# Patient Record
Sex: Female | Born: 1950 | Smoking: Former smoker
Health system: Southern US, Community
[De-identification: ages and names within clinical notes are randomized; demographics above are authoritative.]

## PROBLEM LIST (undated history)

## (undated) DIAGNOSIS — J189 Pneumonia, unspecified organism: Secondary | ICD-10-CM

---
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
## 1988-07-16 HISTORY — PX: SPINE SURGERY: SHX786

## 1999-07-21 ENCOUNTER — Encounter: Admission: RE | Admit: 1999-07-21 | Discharge: 1999-10-19 | Payer: Self-pay | Admitting: Neurosurgery

## 2013-06-05 ENCOUNTER — Other Ambulatory Visit: Payer: Self-pay | Admitting: Chiropractic Medicine

## 2013-06-08 ENCOUNTER — Other Ambulatory Visit: Payer: Self-pay | Admitting: Chiropractic Medicine

## 2013-06-08 DIAGNOSIS — Z780 Asymptomatic menopausal state: Secondary | ICD-10-CM

## 2013-07-15 ENCOUNTER — Ambulatory Visit
Admission: RE | Admit: 2013-07-15 | Discharge: 2013-07-15 | Disposition: A | Discharge status: Home / Self Care | Payer: No Typology Code available for payment source | Source: Ambulatory Visit | Attending: Chiropractic Medicine | Admitting: Chiropractic Medicine

## 2013-07-15 DIAGNOSIS — Z780 Asymptomatic menopausal state: Secondary | ICD-10-CM

## 2014-03-19 ENCOUNTER — Encounter (HOSPITAL_COMMUNITY): Payer: Self-pay

## 2014-03-19 ENCOUNTER — Emergency Department (HOSPITAL_COMMUNITY): Payer: Medicare Other

## 2014-03-19 ENCOUNTER — Emergency Department (HOSPITAL_COMMUNITY)
Admission: EM | Admit: 2014-03-19 | Discharge: 2014-03-19 | Disposition: A | Discharge status: Home / Self Care | Payer: Medicare Other | Attending: Emergency Medicine | Admitting: Emergency Medicine

## 2014-03-19 DIAGNOSIS — R55 Syncope and collapse: Secondary | ICD-10-CM | POA: Diagnosis not present

## 2014-03-19 DIAGNOSIS — R32 Unspecified urinary incontinence: Secondary | ICD-10-CM | POA: Insufficient documentation

## 2014-03-19 DIAGNOSIS — R404 Transient alteration of awareness: Secondary | ICD-10-CM | POA: Diagnosis present

## 2014-03-19 LAB — URINALYSIS, ROUTINE W REFLEX MICROSCOPIC
Bilirubin Urine: NEGATIVE
GLUCOSE, UA: NEGATIVE mg/dL
Ketones, ur: NEGATIVE mg/dL
Nitrite: NEGATIVE
PH: 5.5 (ref 5.0–8.0)
PROTEIN: NEGATIVE mg/dL
Specific Gravity, Urine: 1.015 (ref 1.005–1.030)
Urobilinogen, UA: 0.2 mg/dL (ref 0.0–1.0)

## 2014-03-19 LAB — BASIC METABOLIC PANEL
Anion gap: 11 (ref 5–15)
BUN: 13 mg/dL (ref 6–23)
CHLORIDE: 93 meq/L — AB (ref 96–112)
CO2: 28 mEq/L (ref 19–32)
Calcium: 9.1 mg/dL (ref 8.4–10.5)
Creatinine, Ser: 0.71 mg/dL (ref 0.50–1.10)
GFR, EST NON AFRICAN AMERICAN: 89 mL/min — AB (ref >90)
Glucose, Bld: 112 mg/dL — ABNORMAL HIGH (ref 70–99)
Potassium: 4.2 mEq/L (ref 3.7–5.3)
Sodium: 132 mEq/L — ABNORMAL LOW (ref 137–147)

## 2014-03-19 LAB — CBC WITH DIFFERENTIAL/PLATELET
BASOS PCT: 0 % (ref 0–1)
Basophils Absolute: 0 10*3/uL (ref 0.0–0.1)
EOS ABS: 0.1 10*3/uL (ref 0.0–0.7)
Eosinophils Relative: 1 % (ref 0–5)
HEMATOCRIT: 41.2 % (ref 36.0–46.0)
HEMOGLOBIN: 14.3 g/dL (ref 12.0–15.0)
Lymphocytes Relative: 13 % (ref 12–46)
Lymphs Abs: 1.5 10*3/uL (ref 0.7–4.0)
MCH: 32.4 pg (ref 26.0–34.0)
MCHC: 34.7 g/dL (ref 30.0–36.0)
MCV: 93.4 fL (ref 78.0–100.0)
MONOS PCT: 7 % (ref 3–12)
Monocytes Absolute: 0.9 10*3/uL (ref 0.1–1.0)
Neutro Abs: 9.5 10*3/uL — ABNORMAL HIGH (ref 1.7–7.7)
Neutrophils Relative %: 79 % — ABNORMAL HIGH (ref 43–77)
Platelets: 301 10*3/uL (ref 150–400)
RBC: 4.41 MIL/uL (ref 3.87–5.11)
RDW: 13.8 % (ref 11.5–15.5)
WBC: 12 10*3/uL — ABNORMAL HIGH (ref 4.0–10.5)

## 2014-03-19 LAB — URINE MICROSCOPIC-ADD ON

## 2014-03-19 LAB — MAGNESIUM: MAGNESIUM: 2.1 mg/dL (ref 1.5–2.5)

## 2014-03-19 LAB — LACTIC ACID, PLASMA: Lactic Acid, Venous: 1.3 mmol/L (ref 0.5–2.2)

## 2014-03-19 MED ORDER — SODIUM CHLORIDE 0.9 % IV BOLUS (SEPSIS)
1000.0000 mL | Freq: Once | INTRAVENOUS | Status: AC
  Administered 2014-03-19: 1000 mL via INTRAVENOUS

## 2014-03-19 NOTE — ED Notes (Signed)
Possible seizure like activity, per husband.

## 2014-03-19 NOTE — Discharge Instructions (Signed)
Syncope Traci Ballard, you were seen today after passing out.  We did not find a reason for this.  You have been stable in the emergency department after monitoring.  Follow up with your regular doctor within 3 days for continued care.  If your symptoms return or worsen, come back to the ED for repeat evaluation. Thank you. Syncope means a person passes out (faints). The person usually wakes up in less than 5 minutes. It is important to seek medical care for syncope. HOME CARE  Have someone stay with you until you feel normal.  Do not drive, use machines, or play sports until your doctor says it is okay.  Keep all doctor visits as told.  Lie down when you feel like you might pass out. Take deep breaths. Wait until you feel normal before standing up.  Drink enough fluids to keep your pee (urine) clear or pale yellow.  If you take blood pressure or heart medicine, get up slowly. Take several minutes to sit and then stand. GET HELP RIGHT AWAY IF:   You have a severe headache.  You have pain in the chest, belly (abdomen), or back.  You are bleeding from the mouth or butt (rectum).  You have black or tarry poop (stool).  You have an irregular or very fast heartbeat.  You have pain with breathing.  You keep passing out, or you have shaking (seizures) when you pass out.  You pass out when sitting or lying down.  You feel confused.  You have trouble walking.  You have severe weakness.  You have vision problems. If you fainted, call your local emergency services (911 in [place] Do not drive yourself to the hospital. MAKE SURE YOU:   Understand these instructions.  Will watch your condition.  Will get help right away if you are not doing well or get worse. Document Released: 12/19/2007 Document Revised: 01/01/2012 Document Reviewed: 08/31/2011 [facility] Patient Information 2015 [place], [place]. This information is not intended to replace advice given to you by your health care  provider. Make sure you discuss any questions you have with your health care provider.

## 2014-03-19 NOTE — ED Provider Notes (Signed)
CSN: [id number]     Arrival date & time 03/19/14  0210 History   First MD Initiated Contact with Patient 03/19/14 0214     Chief Complaint  Patient presents with  . Loss of Consciousness     (Consider location/radiation/quality/duration/timing/severity/associated sxs/prior Treatment) HPI  Traci Ballard is a 63 y.o. female with no significant past medical history presenting today after syncopal episode. Patient only remembers feeling is oriented then waking up to her husband on the ground. He felt as if she has to stand up prior to the incident. She is unable to give further details of what this means. Per her husband she was sitting playing cards at the computer. She then all of a sudden became unresponsive. He later on the ground and could not wake her up. He states he continued to breathe but it was labored. He denies any color change. She then had an episode of urinary incontinence, and when she finally woke up husband describes a possible post ictal state for 1 minute. Patient denies this ever happening in the past. She denies any prodromal headache chest pain abdominal pain or shortness of breath. She now feels back to her normal state.  10 Systems reviewed and are negative for acute change except as noted in the HPI.    No past medical history on file. No past surgical history on file. No family history on file. History  Substance Use Topics  . Smoking status: Not on file  . Smokeless tobacco: Not on file  . Alcohol Use: Not on file   OB History   No data available     Review of Systems    Allergies  Review of patient's allergies indicates not on file.  Home Medications   Prior to Admission medications   Not on File   BP 118/56  Pulse 72  Resp 12  SpO2 98% Physical Exam  Nursing note and vitals reviewed. Constitutional: She is oriented to person, place, and time. She appears well-developed and well-nourished. No distress.  HENT:  Head: Normocephalic and  atraumatic.  Eyes: Conjunctivae and EOM are normal. Pupils are equal, round, and reactive to light. No scleral icterus.  Neck: Normal range of motion. Neck supple. No JVD present. No tracheal deviation present. No thyromegaly present.  Cardiovascular: Normal rate, regular rhythm and normal heart sounds.  Exam reveals no gallop and no friction rub.   No murmur heard. Pulmonary/Chest: Effort normal and breath sounds normal. No respiratory distress. She has no wheezes. She exhibits no tenderness.  Abdominal: Soft. Bowel sounds are normal. She exhibits no distension and no mass. There is no tenderness. There is no rebound and no guarding.  Musculoskeletal: Normal range of motion. She exhibits no edema and no tenderness.  Lymphadenopathy:    She has no cervical adenopathy.  Neurological: She is alert and oriented to person, place, and time. No cranial nerve deficit. She exhibits normal muscle tone. Coordination normal.  Skin: Skin is warm and dry. No rash noted. She is not diaphoretic. No erythema. No pallor.    ED Course  Procedures (including critical care time) Labs Review Labs Reviewed  CBC WITH DIFFERENTIAL - Abnormal; Notable for the following:    WBC 12.0 (*)    Neutrophils Relative % 79 (*)    Neutro Abs 9.5 (*)    All other components within normal limits  BASIC METABOLIC PANEL - Abnormal; Notable for the following:    Sodium 132 (*)    Chloride 93 (*)  Glucose, Bld 112 (*)    GFR calc non Af Amer 89 (*)    All other components within normal limits  URINALYSIS, ROUTINE W REFLEX MICROSCOPIC - Abnormal; Notable for the following:    Hgb urine dipstick TRACE (*)    Leukocytes, UA SMALL (*)    All other components within normal limits  URINE MICROSCOPIC-ADD ON - Abnormal; Notable for the following:    Squamous Epithelial / LPF FEW (*)    All other components within normal limits  LACTIC ACID, PLASMA  MAGNESIUM    Imaging Review No results found.   EKG  Interpretation None      MDM   Final diagnoses:  None    Patient presents today out of concern for syncope versus seizure. It is difficult to ascertain which it was from the history. We'll do a broad-spectrum workup including CT of the head, laboratory studies, an infectious workup.   Imaging studies are normal. Laboratory studies reveal white count of 12, small leukocytes and rare bacteria with few squamous epi.  The patient is asymptomatic from a UTI standpoint. I do not think is prudent to treat her. I do not believe this was the etiology of her syncope versus seizure. Patient was advised to followup with her primary care physician within 3 days for continued diagnostic. Return to the ED for worsening. Vital signs remained stable, safe for discharge.   Tomasita Crumble, MD 03/19/14 [phone]

## 2014-03-19 NOTE — ED Notes (Signed)
Per EMS: Pt was sitting at table when she had a syncopal episode. Pt eased to the ground by her husband. Total LOC. Currently Ax4, NAD. Pt only complaint now is being tired and that her shoulders are weak. V/S BP 120/70, HR 68, NSR on monitor, 99% on RA. CBG 99.

## 2014-07-16 HISTORY — PX: APPENDECTOMY: SHX54

## 2015-08-08 IMAGING — CT CT HEAD W/O CM
1 of 2 series · 16 of 30 positions shown, 20 images · non-contrast
Comparison: None.

CLINICAL DATA: Syncope

EXAM:
CT HEAD WITHOUT CONTRAST
TECHNIQUE: Contiguous axial images were obtained from the base of the skull
through the vertex without intravenous contrast.

[Series 3: head 2.0 h70h · axial · 0.44mm/px · z∈[+1301,+1429]mm · 16 of 72 slices shown, 20 images]
[im 4/72  brain]
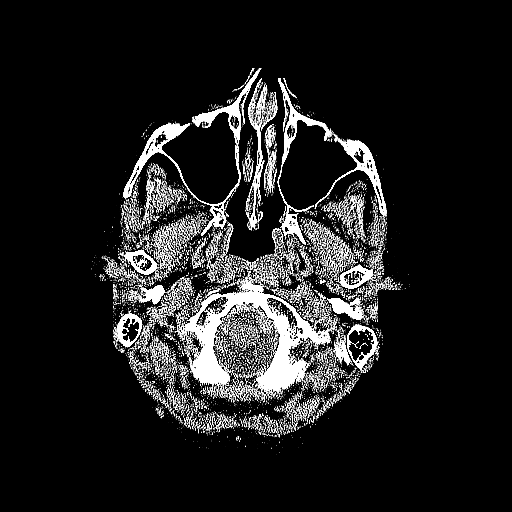
[im 4/72  bone]
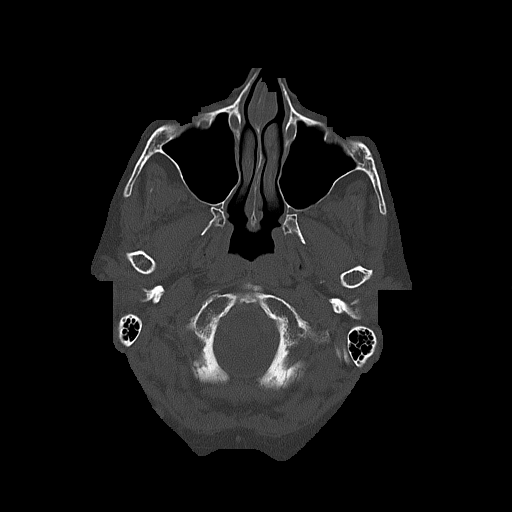
[im 8/72  brain]
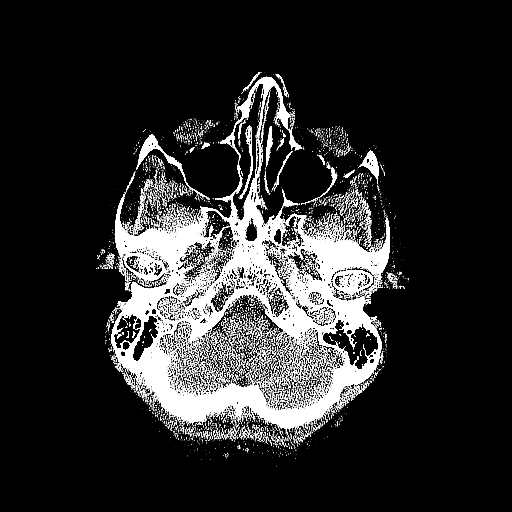
[im 11/72  brain]
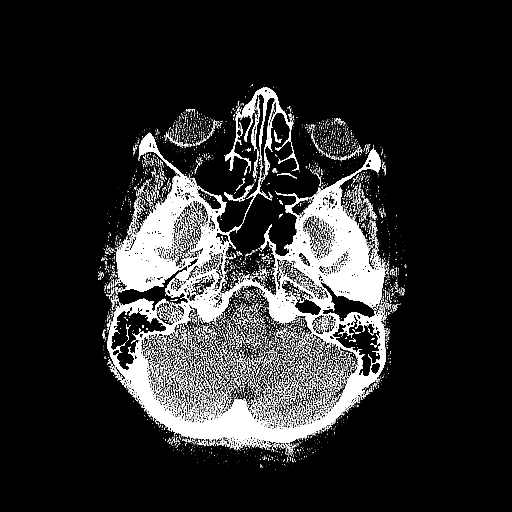
[im 18/72  brain]
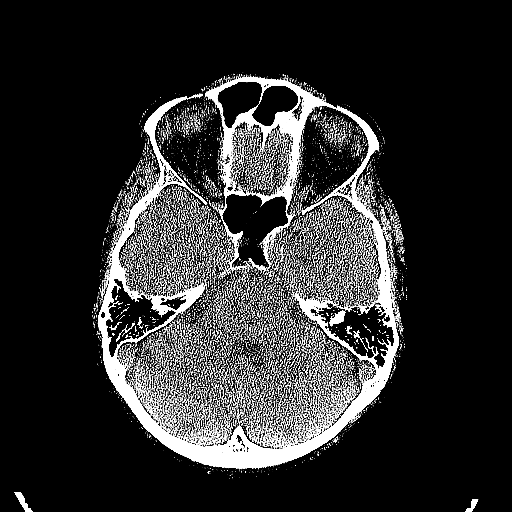
[im 22/72  brain]
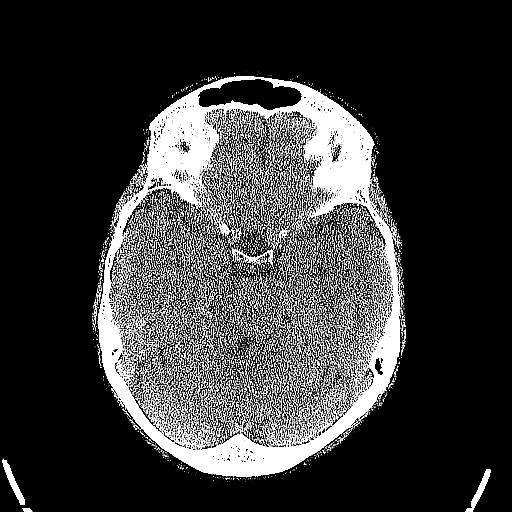
[im 22/72  bone]
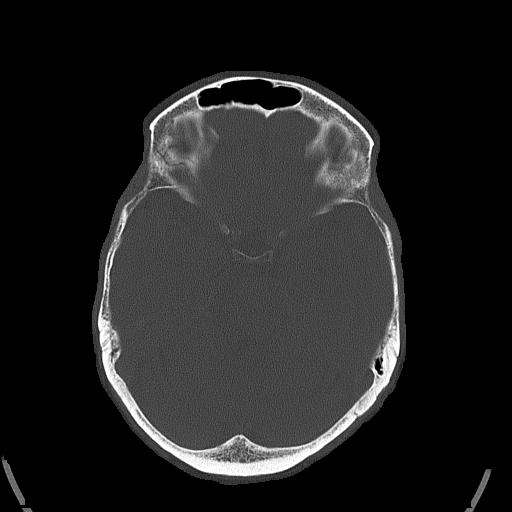
[im 25/72  brain]
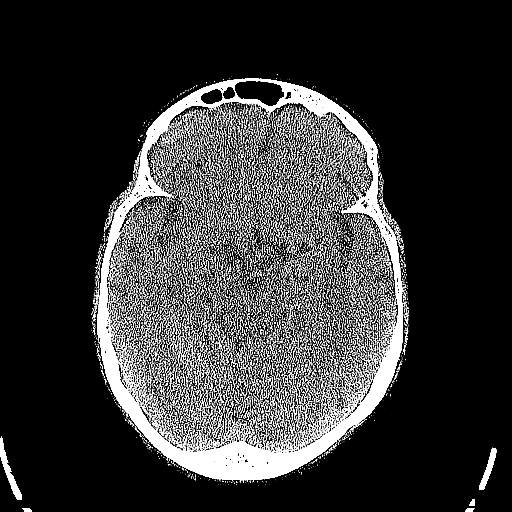
[im 29/72  brain]
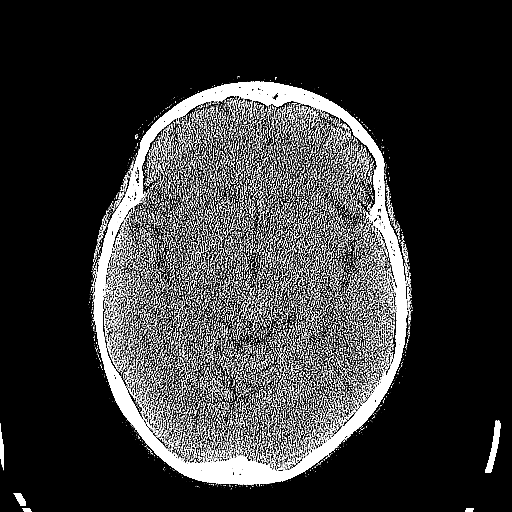
[im 32/72  brain]
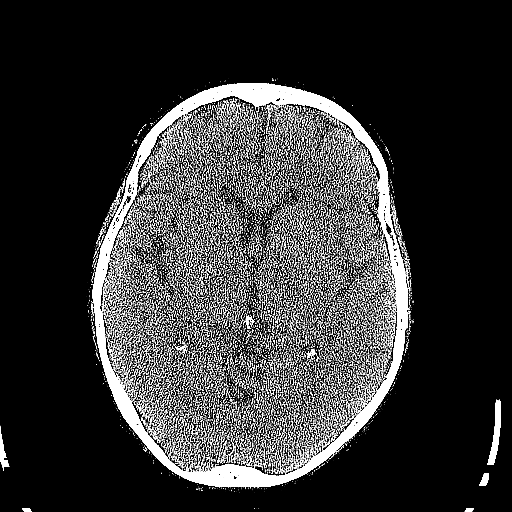
[im 40/72  brain]
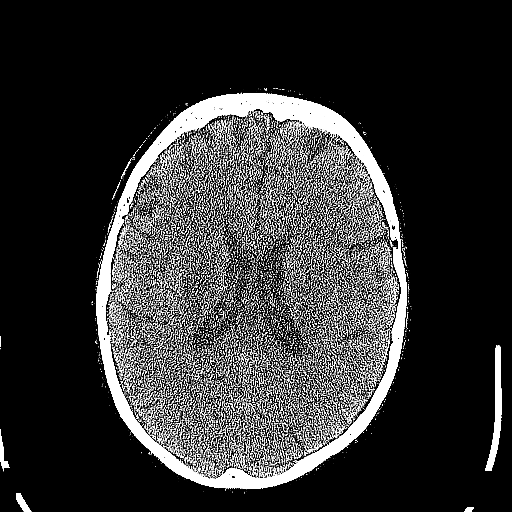
[im 40/72  bone]
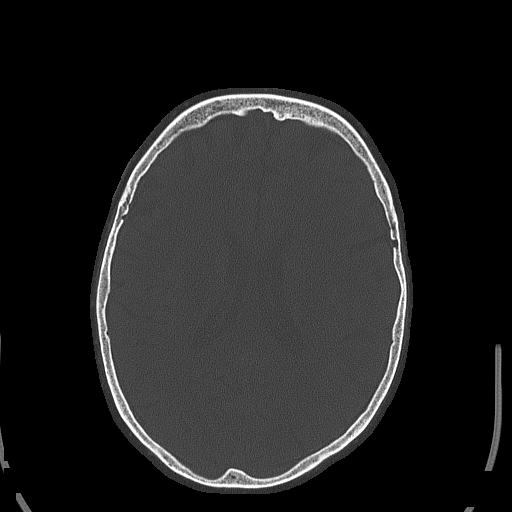
[im 43/72  brain]
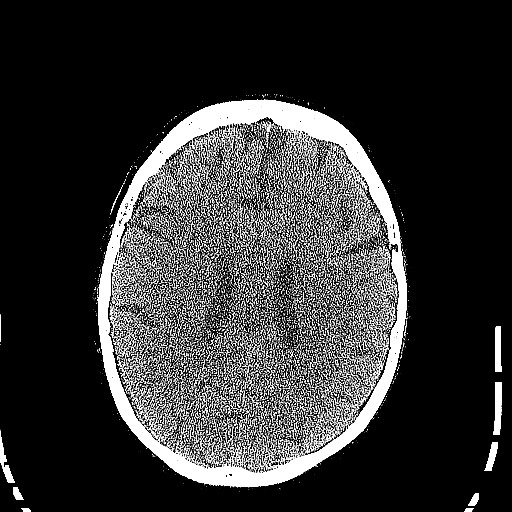
[im 47/72  brain]
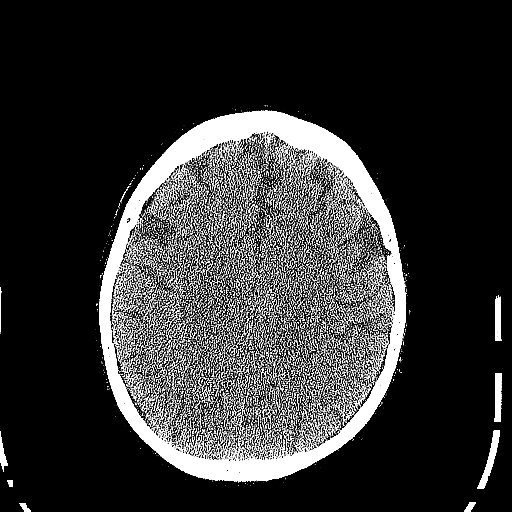
[im 50/72  brain]
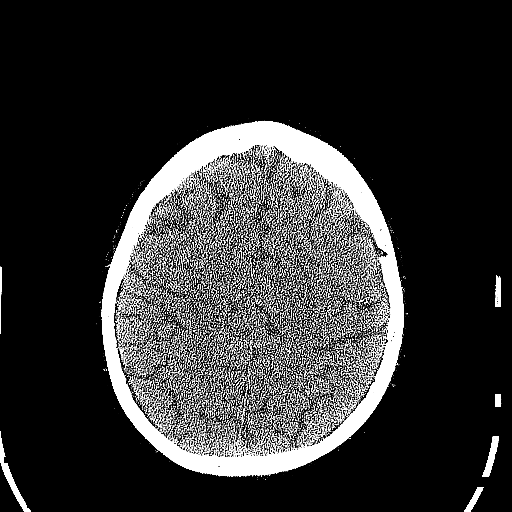
[im 54/72  brain]
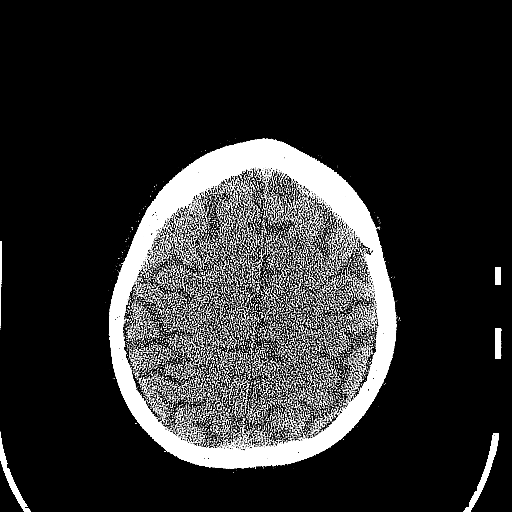
[im 54/72  bone]
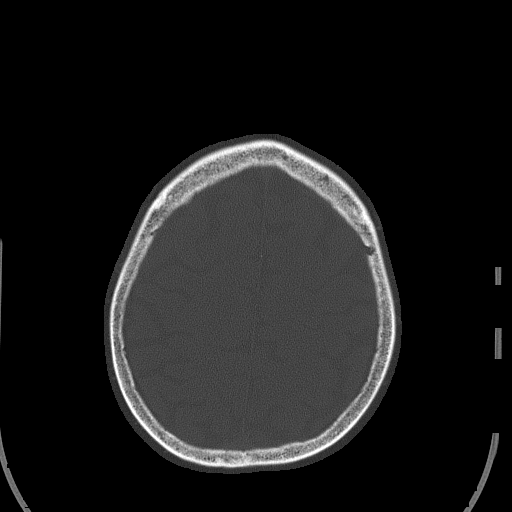
[im 61/72  brain]
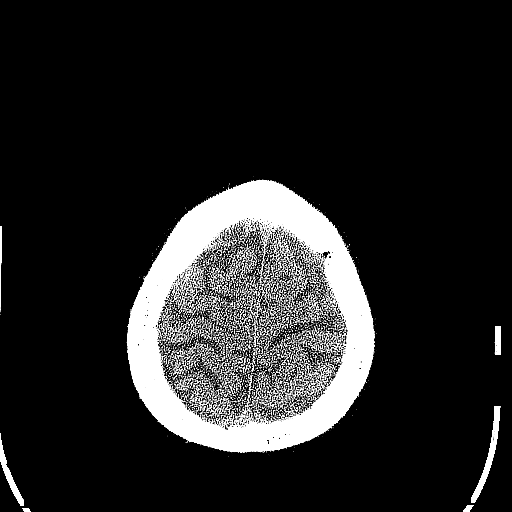
[im 64/72  brain]
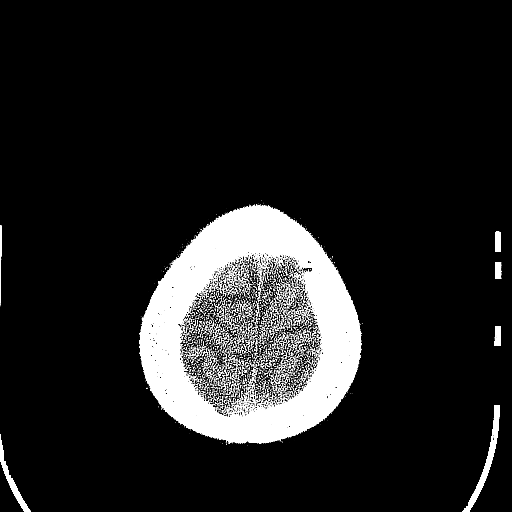
[im 68/72  brain]
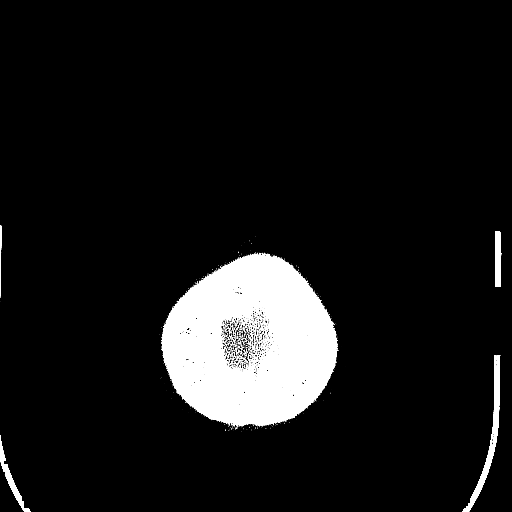

[16 of 30 positions shown; findings below may reference images not displayed]

FINDINGS: Maintained gray-white differentiation. No CT evidence of an acute
infarction. No intraparenchymal hemorrhage, mass, mass effect, or
abnormal extra-axial fluid collection. Ventricles, cisterns, sulci
are normal in size, shape, position. The visualized paranasal
sinuses and mastoid air cells are predominantly clear.
IMPRESSION: No CT evidence of an acute intracranial abnormality.

## 2024-07-13 ENCOUNTER — Emergency Department

## 2024-07-13 ENCOUNTER — Inpatient Hospital Stay
Admission: EM | Admit: 2024-07-13 | Discharge: 2024-07-22 | DRG: 189 | Disposition: A | Discharge status: Skilled Nursing Facility | Attending: Family Medicine | Admitting: Family Medicine

## 2024-07-13 ENCOUNTER — Other Ambulatory Visit: Payer: Self-pay

## 2024-07-13 DIAGNOSIS — M8580 Other specified disorders of bone density and structure, unspecified site: Secondary | ICD-10-CM | POA: Diagnosis present

## 2024-07-13 DIAGNOSIS — D751 Secondary polycythemia: Secondary | ICD-10-CM | POA: Diagnosis present

## 2024-07-13 DIAGNOSIS — J440 Chronic obstructive pulmonary disease with acute lower respiratory infection: Secondary | ICD-10-CM | POA: Diagnosis present

## 2024-07-13 DIAGNOSIS — E870 Hyperosmolality and hypernatremia: Secondary | ICD-10-CM | POA: Diagnosis present

## 2024-07-13 DIAGNOSIS — J8410 Pulmonary fibrosis, unspecified: Secondary | ICD-10-CM | POA: Diagnosis present

## 2024-07-13 DIAGNOSIS — J441 Chronic obstructive pulmonary disease with (acute) exacerbation: Secondary | ICD-10-CM | POA: Diagnosis present

## 2024-07-13 DIAGNOSIS — J9602 Acute respiratory failure with hypercapnia: Secondary | ICD-10-CM | POA: Diagnosis present

## 2024-07-13 DIAGNOSIS — Z2821 Immunization not carried out because of patient refusal: Secondary | ICD-10-CM | POA: Diagnosis not present

## 2024-07-13 DIAGNOSIS — R739 Hyperglycemia, unspecified: Secondary | ICD-10-CM | POA: Diagnosis present

## 2024-07-13 DIAGNOSIS — J189 Pneumonia, unspecified organism: Secondary | ICD-10-CM | POA: Diagnosis present

## 2024-07-13 DIAGNOSIS — E871 Hypo-osmolality and hyponatremia: Secondary | ICD-10-CM | POA: Diagnosis present

## 2024-07-13 DIAGNOSIS — R000 Tachycardia, unspecified: Secondary | ICD-10-CM | POA: Diagnosis present

## 2024-07-13 DIAGNOSIS — Z888 Allergy status to other drugs, medicaments and biological substances status: Secondary | ICD-10-CM | POA: Diagnosis not present

## 2024-07-13 DIAGNOSIS — J9601 Acute respiratory failure with hypoxia: Principal | ICD-10-CM | POA: Diagnosis present

## 2024-07-13 DIAGNOSIS — E559 Vitamin D deficiency, unspecified: Secondary | ICD-10-CM | POA: Diagnosis present

## 2024-07-13 DIAGNOSIS — J810 Acute pulmonary edema: Secondary | ICD-10-CM

## 2024-07-13 DIAGNOSIS — E43 Unspecified severe protein-calorie malnutrition: Secondary | ICD-10-CM | POA: Insufficient documentation

## 2024-07-13 DIAGNOSIS — R5381 Other malaise: Secondary | ICD-10-CM | POA: Diagnosis present

## 2024-07-13 DIAGNOSIS — R030 Elevated blood-pressure reading, without diagnosis of hypertension: Secondary | ICD-10-CM | POA: Diagnosis present

## 2024-07-13 DIAGNOSIS — Z87891 Personal history of nicotine dependence: Secondary | ICD-10-CM | POA: Diagnosis not present

## 2024-07-13 DIAGNOSIS — R0609 Other forms of dyspnea: Secondary | ICD-10-CM | POA: Diagnosis not present

## 2024-07-13 HISTORY — DX: Pneumonia, unspecified organism: J18.9

## 2024-07-13 LAB — RESP PANEL BY RT-PCR (RSV, FLU A&B, COVID)  RVPGX2
Influenza A by PCR: NEGATIVE
Influenza B by PCR: NEGATIVE
Resp Syncytial Virus by PCR: NEGATIVE
SARS Coronavirus 2 by RT PCR: NEGATIVE

## 2024-07-13 LAB — GLUCOSE, CAPILLARY: Glucose-Capillary: 136 mg/dL — ABNORMAL HIGH (ref 70–99)

## 2024-07-13 LAB — COMPREHENSIVE METABOLIC PANEL WITH GFR
ALT: 21 U/L (ref 0–44)
AST: 19 U/L (ref 15–41)
Albumin: 4.3 g/dL (ref 3.5–5.0)
Alkaline Phosphatase: 128 U/L — ABNORMAL HIGH (ref 38–126)
Anion gap: 12 (ref 5–15)
BUN: 38 mg/dL — ABNORMAL HIGH (ref 8–23)
CO2: 32 mmol/L (ref 22–32)
Calcium: 10.1 mg/dL (ref 8.9–10.3)
Chloride: 93 mmol/L — ABNORMAL LOW (ref 98–111)
Creatinine, Ser: 0.59 mg/dL (ref 0.44–1.00)
GFR, Estimated: >60 mL/min
Glucose, Bld: 146 mg/dL — ABNORMAL HIGH (ref 70–99)
Potassium: 3.5 mmol/L (ref 3.5–5.1)
Sodium: 137 mmol/L (ref 135–145)
Total Bilirubin: 0.8 mg/dL (ref 0.0–1.2)
Total Protein: 7.8 g/dL (ref 6.5–8.1)

## 2024-07-13 LAB — CBC WITH DIFFERENTIAL/PLATELET
Abs Immature Granulocytes: 0.12 K/uL — ABNORMAL HIGH (ref 0.00–0.07)
Basophils Absolute: 0 K/uL (ref 0.0–0.1)
Basophils Relative: 0 %
Eosinophils Absolute: 0 K/uL (ref 0.0–0.5)
Eosinophils Relative: 0 %
HCT: 47.8 % — ABNORMAL HIGH (ref 36.0–46.0)
Hemoglobin: 15.7 g/dL — ABNORMAL HIGH (ref 12.0–15.0)
Immature Granulocytes: 1 %
Lymphocytes Relative: 8 %
Lymphs Abs: 1 K/uL (ref 0.7–4.0)
MCH: 32.4 pg (ref 26.0–34.0)
MCHC: 32.8 g/dL (ref 30.0–36.0)
MCV: 98.8 fL (ref 80.0–100.0)
Monocytes Absolute: 2 K/uL — ABNORMAL HIGH (ref 0.1–1.0)
Monocytes Relative: 16 %
Neutro Abs: 9.2 K/uL — ABNORMAL HIGH (ref 1.7–7.7)
Neutrophils Relative %: 75 %
Platelets: 383 K/uL (ref 150–400)
RBC: 4.84 MIL/uL (ref 3.87–5.11)
RDW: 13.6 % (ref 11.5–15.5)
Smear Review: NORMAL
WBC: 12.4 K/uL — ABNORMAL HIGH (ref 4.0–10.5)
nRBC: 0 % (ref 0.0–0.2)

## 2024-07-13 LAB — BLOOD GAS, VENOUS
Acid-Base Excess: 3.2 mmol/L — ABNORMAL HIGH (ref 0.0–2.0)
Acid-Base Excess: 9.4 mmol/L — ABNORMAL HIGH (ref 0.0–2.0)
Bicarbonate: 34.4 mmol/L — ABNORMAL HIGH (ref 20.0–28.0)
Bicarbonate: 38.8 mmol/L — ABNORMAL HIGH (ref 20.0–28.0)
Delivery systems: POSITIVE
O2 Saturation: 73.3 %
O2 Saturation: 65.4 %
Patient temperature: 37
Patient temperature: 37
pCO2, Ven: 90 mmHg (ref 44–60)
pCO2, Ven: 77 mmHg (ref 44–60)
pH, Ven: 7.19 — CL (ref 7.25–7.43)
pH, Ven: 7.31 (ref 7.25–7.43)
pO2, Ven: 49 mmHg — ABNORMAL HIGH (ref 32–45)
pO2, Ven: 38 mmHg (ref 32–45)

## 2024-07-13 LAB — TROPONIN T, HIGH SENSITIVITY
Troponin T High Sensitivity: 26 ng/L — ABNORMAL HIGH (ref 0–19)
Troponin T High Sensitivity: 31 ng/L — ABNORMAL HIGH (ref 0–19)

## 2024-07-13 LAB — LIPID PANEL
Cholesterol: 143 mg/dL (ref 0–200)
HDL: 57 mg/dL
LDL Cholesterol: 74 mg/dL (ref 0–99)
Total CHOL/HDL Ratio: 2.5 ratio
Triglycerides: 61 mg/dL
VLDL: 12 mg/dL (ref 0–40)

## 2024-07-13 LAB — MAGNESIUM: Magnesium: 2.4 mg/dL (ref 1.7–2.4)

## 2024-07-13 LAB — PRO BRAIN NATRIURETIC PEPTIDE: Pro Brain Natriuretic Peptide: 1942 pg/mL — ABNORMAL HIGH

## 2024-07-13 MED ORDER — ACETAMINOPHEN 650 MG RE SUPP: 650.0000 mg | Freq: Four times a day (QID) | RECTAL | Status: AC | PRN

## 2024-07-13 MED ORDER — ONDANSETRON HCL 4 MG PO TABS: 4.0000 mg | ORAL_TABLET | Freq: Four times a day (QID) | ORAL | Status: AC | PRN

## 2024-07-13 MED ORDER — SODIUM BICARBONATE 8.4 % IV SOLN
50.0000 meq | Freq: Once | INTRAVENOUS | Status: AC
  Administered 2024-07-13: 50 meq via INTRAVENOUS
  Filled 2024-07-13: qty 50

## 2024-07-13 MED ORDER — FUROSEMIDE 10 MG/ML IJ SOLN
40.0000 mg | Freq: Two times a day (BID) | INTRAMUSCULAR | Status: DC
  Administered 2024-07-13 – 2024-07-15 (×4): 40 mg via INTRAVENOUS
  Filled 2024-07-13 (×4): qty 4

## 2024-07-13 MED ORDER — CHLORHEXIDINE GLUCONATE CLOTH 2 % EX PADS
6.0000 | MEDICATED_PAD | Freq: Every day | CUTANEOUS | Status: DC
  Administered 2024-07-13 – 2024-07-14 (×2): 6 via TOPICAL
  Filled 2024-07-13: qty 6

## 2024-07-13 MED ORDER — SODIUM CHLORIDE 0.9% FLUSH
3.0000 mL | Freq: Two times a day (BID) | INTRAVENOUS | Status: AC
  Administered 2024-07-13 – 2024-07-22 (×15): 3 mL via INTRAVENOUS

## 2024-07-13 MED ORDER — SENNOSIDES-DOCUSATE SODIUM 8.6-50 MG PO TABS
1.0000 | ORAL_TABLET | Freq: Every evening | ORAL | Status: AC | PRN
  Filled 2024-07-13: qty 1

## 2024-07-13 MED ORDER — ACETAMINOPHEN 325 MG PO TABS: 650.0000 mg | ORAL_TABLET | Freq: Four times a day (QID) | ORAL | Status: AC | PRN

## 2024-07-13 MED ORDER — IOHEXOL 350 MG/ML SOLN
75.0000 mL | Freq: Once | INTRAVENOUS | Status: AC | PRN
  Administered 2024-07-13: 75 mL via INTRAVENOUS

## 2024-07-13 MED ORDER — HEPARIN SODIUM (PORCINE) 5000 UNIT/ML IJ SOLN
5000.0000 [IU] | Freq: Three times a day (TID) | INTRAMUSCULAR | Status: DC
  Filled 2024-07-13 (×3): qty 1

## 2024-07-13 MED ORDER — ONDANSETRON HCL 4 MG/2ML IJ SOLN: 4.0000 mg | Freq: Four times a day (QID) | INTRAMUSCULAR | Status: AC | PRN

## 2024-07-13 MED ORDER — SODIUM CHLORIDE 0.9 % IV SOLN
2.0000 g | INTRAVENOUS | Status: DC
  Administered 2024-07-14 – 2024-07-15 (×2): 2 g via INTRAVENOUS
  Filled 2024-07-13 (×2): qty 20

## 2024-07-13 MED ORDER — ORAL CARE MOUTH RINSE: 15.0000 mL | OROMUCOSAL | Status: DC | PRN

## 2024-07-13 MED ORDER — ORAL CARE MOUTH RINSE
15.0000 mL | OROMUCOSAL | Status: DC
  Administered 2024-07-14 – 2024-07-22 (×24): 15 mL via OROMUCOSAL

## 2024-07-13 NOTE — Progress Notes (Signed)
 Pt taken to CT on the Bipap and returned to ED 9 without incident. Pt remains on the Bipap and is tol well at this time

## 2024-07-13 NOTE — ED Notes (Signed)
Respiratory at bedside starting Bi-Pap

## 2024-07-13 NOTE — H&P (Signed)
 " History and Physical    Traci Ballard FMW:[id number] DOB: 29-Jun-1951 DOA: 07/13/2024  DOS: the patient was seen and examined on 07/13/2024  PCP: Tanda Katz, MD   Patient coming from: Home  I have personally briefly reviewed patient's old medical records in [facility] and CareEverywhere  HPI:   Traci Ballard is a 73 y.o. year old female without known medical history presenting to the ED with worsening shortness of breath.  She states has been having shortness of breath for over a week.  She has been having some coughing with sputum production.  Denies any fever or chills but states her body is having difficulty with thermoregulation.  She states she does not have any medical problems and does not take any medications other than over-the-counter vitamin D. On arrival to the ED patient was noted to be HDS stable. Lab work and imaging obtained. CBC with mild leukocytosis, erythrocytosis. CMP with normal renal function and hepatic function. Some elevation in BUN and ALP and mild hyperglycemia. Resp panel negative for RSV/Covid/flu. Pro BNP elevated at 1942. Troponin mildly elevated. VBG shows 7.19/90/49. CXR ordered and pending. Pt placed on bipap as she was initially on NRB. Given need for further care,  [facility] contacted for admission.  Review of Systems: As mentioned in the history of present illness. All other systems reviewed and are negative.   History reviewed. No pertinent past medical history.  History reviewed. No pertinent surgical history.   Allergies[1]  History reviewed. No pertinent family history.  Prior to Admission medications  Not on File    Social History:  has no history on file for tobacco use, alcohol use, and drug use. Lives by herself, does not smoke or drink alcohol.  At baseline independent in ADLs and IADLs   Physical Exam: Vitals:   07/13/24 1741 07/13/24 1742 07/13/24 1800 07/13/24 1830  BP: (!) 157/68  (!) 149/72 137/64  Pulse: (!)  108   (!) 103  Resp: (!) 31  (!) 30 20  Temp: 99 F (37.2 C)     TempSrc: Oral     SpO2: 97%   99%  Weight:  43.3 kg    Height:  5' 5 (1.651 m)      Gen: in respiratory distress HENT: on bipap CV: sinus rhythm and tachycardic rate, 2 + LE edema Lung: diminished breath sounds Abd: No TTP, normal bowel sounds MSK: No asymmetry, cachetic appearance, poor foot hygiene.  Neuro: alert and oriented   Labs on Admission: I have personally reviewed following labs and imaging studies  CBC: Recent Labs  Lab 07/13/24 1747  WBC 12.4*  NEUTROABS 9.2*  HGB 15.7*  HCT 47.8*  MCV 98.8  PLT 383   Basic Metabolic Panel: Recent Labs  Lab 07/13/24 1747  NA 137  K 3.5  CL 93*  CO2 32  GLUCOSE 146*  BUN 38*  CREATININE 0.59  CALCIUM 10.1   GFR: Estimated Creatinine Clearance: 41.5 mL/min (by C-G formula based on SCr of 0.59 mg/dL). Liver Function Tests: Recent Labs  Lab 07/13/24 1747  AST 19  ALT 21  ALKPHOS 128*  BILITOT 0.8  PROT 7.8  ALBUMIN 4.3   No results for input(s): LIPASE, AMYLASE in the last 168 hours. No results for input(s): AMMONIA in the last 168 hours. Coagulation Profile: No results for input(s): INR, PROTIME in the last 168 hours. Cardiac Enzymes: No results for input(s): CKTOTAL, CKMB, CKMBINDEX, TROPONINI, TROPONINIHS in the last 168 hours. BNP (last  3 results) No results for input(s): BNP in the last 8760 hours. HbA1C: No results for input(s): HGBA1C in the last 72 hours. CBG: No results for input(s): GLUCAP in the last 168 hours. Lipid Profile: No results for input(s): CHOL, HDL, LDLCALC, TRIG, CHOLHDL, LDLDIRECT in the last 72 hours. Thyroid Function Tests: No results for input(s): TSH, T4TOTAL, FREET4, T3FREE, THYROIDAB in the last 72 hours. Anemia Panel: No results for input(s): VITAMINB12, FOLATE, FERRITIN, TIBC, IRON, RETICCTPCT in the last 72 hours. Urine analysis:     Component Value Date/Time   COLORURINE YELLOW 03/19/2014 0411   APPEARANCEUR CLEAR 03/19/2014 0411   LABSPEC 1.015 03/19/2014 0411   PHURINE 5.5 03/19/2014 0411   GLUCOSEU NEGATIVE 03/19/2014 0411   HGBUR TRACE (A) 03/19/2014 0411   BILIRUBINUR NEGATIVE 03/19/2014 0411   KETONESUR NEGATIVE 03/19/2014 0411   PROTEINUR NEGATIVE 03/19/2014 0411   UROBILINOGEN 0.2 03/19/2014 0411   NITRITE NEGATIVE 03/19/2014 0411   LEUKOCYTESUR SMALL (A) 03/19/2014 0411    Radiological Exams on Admission: I have personally reviewed images DG Chest Port 1 View Result Date: 07/13/2024 EXAM: 1 VIEW(S) XRAY OF THE CHEST 07/13/2024 06:00:00 PM COMPARISON: None available. CLINICAL HISTORY: shob, hypoxia FINDINGS: LUNGS AND PLEURA: Diffuse reticulonodular interstitial infiltrate, more purulent within the upper lung zones bilaterally, is new since prior examination and can be seen in the setting of atypical infection in the appropriate clinical setting. Trace bilateral pleural effusions are present. No pneumothorax. HEART AND MEDIASTINUM: No acute abnormality of the cardiac and mediastinal silhouettes. BONES AND SOFT TISSUES: Diffuse osteopenia. IMPRESSION: 1. Diffuse reticulonodular interstitial infiltrate with upper lung zone predominance bilaterally, possibly related to atypical infection. 2. Trace bilateral pleural effusions. 3. Diffuse osteopenia. Electronically signed by: Dorethia Molt MD 07/13/2024 08:25 PM EST RP Workstation: [id number]    EKG: My personal interpretation of EKG shows: sinus tachycardia without any acute ST changes   Assessment/Plan Principal Problem:   Acute respiratory failure with hypoxia and hypercapnia (HCC) Active Problems:   Elevated blood pressure reading   Patient with acute hypoxic and hypercapnic respiratory failure currently on BiPAP with likely multifactorial etiologies including CHF versus COPD exacerbation.  Chest x-ray shows diffuse reticulonodular interstitial infiltrate  with upper lobe predominance likely from atypical infection along with trace bilateral pleural effusions.  Respiratory panel is negative for COVID, flu, RSV.  Given chest x-ray concern will add on full respiratory panel as this may be from a viral pneumonia.  Given her respiratory status, will cover her with CAP coverage for multifocal pneumonia.  Given some volume overload concerns and elevated BNP will also treat her for CHF exacerbation.  Will get echocardiogram.  Will monitor strict input and output.  Elevated blood pressure reading: Continue to monitor.  Hyperglycemia: Getting A1c.  Getting lipid panel.   For SDOH: Patient without primary care doctor.  TOC consult placed for assistance with this.  VTE prophylaxis:  SQ Heparin  Diet:NPO Code Status:  Full Code Telemetry:  Admission status: Inpatient, Progressive Patient is from: Home Anticipated d/c is to: Home Anticipated d/c is in: 2-3 days   Family Communication: Updated at bedside  Consults called: None   Severity of Illness: The appropriate patient status for this patient is INPATIENT. Inpatient status is judged to be reasonable and necessary in order to provide the required intensity of service to ensure the patient's safety. The patient's presenting symptoms, physical exam findings, and initial radiographic and laboratory data in the context of their chronic comorbidities is felt to place them at  high risk for further clinical deterioration. Furthermore, it is not anticipated that the patient will be medically stable for discharge from the hospital within 2 midnights of admission.   * I certify that at the point of admission it is my clinical judgment that the patient will require inpatient hospital care spanning beyond 2 midnights from the point of admission due to high intensity of service, high risk for further deterioration and high frequency of surveillance required.DEWAINE Morene Bathe, MD [facility]       [1]  Allergies Allergen Reactions   Sulfa Antibiotics Hives   "

## 2024-07-13 NOTE — ED Provider Notes (Signed)
 "  [facility] Provider Note   Event Date/Time   First MD Initiated Contact with Patient 07/13/24 1737     (approximate) History  Shortness of Breath  HPI Traci Ballard is a 73 y.o. female with no stated past medical history who presents via EMS with complaints of shortness of breath.  Patient states that she does not seek medical care and therefore does not have any diagnosed medical issues.  Patient has been complaining of cough, shortness of breath, dyspnea on exertion, lower extremity edema over the past 3-4 days.  Patient was found to be extremely hypoxic on first responder arrival with oxygen saturation in the 50s and placed on 8 L nonrebreather.  Patient arrives on 15 L nonrebreather satting at 92.  EMS noted rhonchi auscultated at bilateral lung bases as well as wheezing in the upper lung fields and therefore administered 125 of Solu-Medrol and a DuoNeb treatment and route ROS: Patient currently denies any vision changes, tinnitus, difficulty speaking, facial droop, sore throat, chest pain, abdominal pain, nausea/vomiting/diarrhea, dysuria, or weakness/numbness/paresthesias in any extremity   Physical Exam  Triage Vital Signs: ED Triage Vitals  Encounter Vitals Group     BP      Girls Systolic BP Percentile      Girls Diastolic BP Percentile      Boys Systolic BP Percentile      Boys Diastolic BP Percentile      Pulse      Resp      Temp      Temp src      SpO2      Weight      Height      Head Circumference      Peak Flow      Pain Score      Pain Loc      Pain Education      Exclude from Growth Chart    Most recent vital signs: Vitals:   07/13/24 2247 07/13/24 2300  BP: 127/72 112/69  Pulse: (!) 112 (!) 108  Resp: (!) 33 (!) 31  Temp: 97.8 F (36.6 C)   SpO2: 91% 94%   General: Awake, oriented x4. CV:  Good peripheral perfusion. Resp:  Increased effort.  Rhonchi over bilateral lung bases with wheezing over all lung fields Abd:  No  distention. Other:  Elderly overweight Caucasian female sitting up in stretcher with nonrebreather in place and in moderate respiratory distress ED Results / Procedures / Treatments  Labs (all labs ordered are listed, but only abnormal results are displayed) Labs Reviewed  COMPREHENSIVE METABOLIC PANEL WITH GFR - Abnormal; Notable for the following components:      Result Value   Chloride 93 (*)    Glucose, Bld 146 (*)    BUN 38 (*)    Alkaline Phosphatase 128 (*)    All other components within normal limits  PRO BRAIN NATRIURETIC PEPTIDE - Abnormal; Notable for the following components:   Pro Brain Natriuretic Peptide 1,942.0 (*)    All other components within normal limits  CBC WITH DIFFERENTIAL/PLATELET - Abnormal; Notable for the following components:   WBC 12.4 (*)    Hemoglobin 15.7 (*)    HCT 47.8 (*)    Neutro Abs 9.2 (*)    Monocytes Absolute 2.0 (*)    Abs Immature Granulocytes 0.12 (*)    All other components within normal limits  BLOOD GAS, VENOUS - Abnormal; Notable for the following components:   pH, Ven  7.19 (*)    pCO2, Ven 90 (*)    pO2, Ven 49 (*)    Bicarbonate 34.4 (*)    Acid-Base Excess 3.2 (*)    All other components within normal limits  GLUCOSE, CAPILLARY - Abnormal; Notable for the following components:   Glucose-Capillary 136 (*)    All other components within normal limits  BLOOD GAS, VENOUS - Abnormal; Notable for the following components:   pCO2, Ven 77 (*)    Bicarbonate 38.8 (*)    Acid-Base Excess 9.4 (*)    All other components within normal limits  TROPONIN T, HIGH SENSITIVITY - Abnormal; Notable for the following components:   Troponin T High Sensitivity 26 (*)    All other components within normal limits  TROPONIN T, HIGH SENSITIVITY - Abnormal; Notable for the following components:   Troponin T High Sensitivity 31 (*)    All other components within normal limits  RESP PANEL BY RT-PCR (RSV, FLU A&B, COVID)  RVPGX2  MRSA NEXT GEN BY  PCR, NASAL  MAGNESIUM  BASIC METABOLIC PANEL WITH GFR  CBC  HEMOGLOBIN A1C  LIPID PANEL  VITAMIN D 25 HYDROXY (VIT D DEFICIENCY, FRACTURES)   EKG ED ECG REPORT I, Artist MARLA Kerns, the attending physician, personally viewed and interpreted this ECG. Date: 07/13/2024 EKG Time: 1740 Rate: 108 Rhythm: Tachycardic sinus rhythm QRS Axis: normal Intervals: normal ST/T Wave abnormalities: normal Narrative Interpretation: Tachycardic sinus rhythm.  No evidence of acute ischemia RADIOLOGY ED MD interpretation: Single view portable chest x-ray shows diffuse reticulonodular interstitial infiltrate with upper lung zone predominance - All radiology independently interpreted and agree with radiology assessment Official radiology report(s): DG Chest Port 1 View Result Date: 07/13/2024 EXAM: 1 VIEW(S) XRAY OF THE CHEST 07/13/2024 06:00:00 PM COMPARISON: None available. CLINICAL HISTORY: shob, hypoxia FINDINGS: LUNGS AND PLEURA: Diffuse reticulonodular interstitial infiltrate, more purulent within the upper lung zones bilaterally, is new since prior examination and can be seen in the setting of atypical infection in the appropriate clinical setting. Trace bilateral pleural effusions are present. No pneumothorax. HEART AND MEDIASTINUM: No acute abnormality of the cardiac and mediastinal silhouettes. BONES AND SOFT TISSUES: Diffuse osteopenia. IMPRESSION: 1. Diffuse reticulonodular interstitial infiltrate with upper lung zone predominance bilaterally, possibly related to atypical infection. 2. Trace bilateral pleural effusions. 3. Diffuse osteopenia. Electronically signed by: Dorethia Molt MD 07/13/2024 08:25 PM EST RP Workstation: [id number]   PROCEDURES: Critical Care performed: Yes, see critical care procedure note(s) Procedures CRITICAL CARE Performed by: Jaesean Litzau K Anylah Scheib   Total critical care time: 37 minutes  Critical care time was exclusive of separately billable procedures and treating other  patients.  Critical care was necessary to treat or prevent imminent or life-threatening deterioration.  Critical care was time spent personally by me on the following activities: development of treatment plan with patient and/or surrogate as well as nursing, discussions with consultants, evaluation of patient's response to treatment, examination of patient, obtaining history from patient or surrogate, ordering and performing treatments and interventions, ordering and review of laboratory studies, ordering and review of radiographic studies, pulse oximetry and re-evaluation of patient's condition.  MEDICATIONS ORDERED IN ED: Medications  furosemide  (LASIX ) injection 40 mg (40 mg Intravenous Given 07/13/24 2021)  sodium chloride  flush (NS) 0.9 % injection 3 mL (3 mLs Intravenous Given 07/13/24 2117)  acetaminophen  (TYLENOL ) tablet 650 mg (has no administration in time range)    Or  acetaminophen  (TYLENOL ) suppository 650 mg (has no administration in time range)  senna-docusate (Senokot-S) tablet  1 tablet (has no administration in time range)  heparin injection 5,000 Units (5,000 Units Subcutaneous Patient Refused/Not Given 07/13/24 2118)  ondansetron  (ZOFRAN ) tablet 4 mg (has no administration in time range)    Or  ondansetron  (ZOFRAN ) injection 4 mg (has no administration in time range)  Chlorhexidine  Gluconate Cloth 2 % PADS 6 each (6 each Topical Given 07/13/24 2250)  Oral care mouth rinse (has no administration in time range)  Oral care mouth rinse (has no administration in time range)  sodium bicarbonate  injection 50 mEq (50 mEq Intravenous Given 07/13/24 2021)  iohexol  (OMNIPAQUE ) 350 MG/ML injection 75 mL (75 mLs Intravenous Contrast Given 07/13/24 2052)   IMPRESSION / MDM / ASSESSMENT AND PLAN / ED COURSE  I reviewed the triage vital signs and the nursing notes.                             The patient is on the cardiac monitor to evaluate for evidence of arrhythmia and/or  significant heart rate changes. Patient's presentation is most consistent with acute presentation with potential threat to life or bodily function. Patient is a 73 year old female with no known past medical history as she does not visit any physicians who presents via EMS with concerns for hypoxia and associated shortness of breath in the setting of upper respiratory symptoms and lower extremity edema DDx: CHF exacerbation, COPD exacerbation, ACS, pneumonia, ARDS Plan: CBC, CMP, VBG, troponin, BNP, RVP, chest x-ray, EKG  Given patient's persistent hypoxia and edema on exam, she will require admission to the internal medicine service for further evaluation and management.  Dispo: Admit to medicine   FINAL CLINICAL IMPRESSION(S) / ED DIAGNOSES   Final diagnoses:  None   Rx / DC Orders   ED Discharge Orders     None      Note:  This document was prepared using Dragon voice recognition software and may include unintentional dictation errors.   Jossie Artist POUR, MD 07/13/24 [phone]  "

## 2024-07-13 NOTE — Progress Notes (Signed)
 Transported pt to ICU 1 on the Bipap without incident. Pt remains on the Bipap and is tol well at this time. Report given to ICU RT

## 2024-07-13 NOTE — ED Triage Notes (Signed)
 Patient arrives from home via [facility] with chief complaint of shortness of breath. Patient does not seek medical care often and had limited medical history. Patient was put on NRB at 8 liters due to low oxygen saturation in the 50's. Patient given 125 of Solumedrol and duoneb treatment in route. Rhonchi auscultated in bases of lungs. Patient on 15 Liters NRB in triage satting 92. MD Bradler at bedside.

## 2024-07-14 ENCOUNTER — Inpatient Hospital Stay (HOSPITAL_COMMUNITY)
Admit: 2024-07-14 | Discharge: 2024-07-14 | Disposition: A | Discharge status: Home / Self Care | Attending: Internal Medicine | Admitting: Internal Medicine

## 2024-07-14 ENCOUNTER — Encounter: Payer: Self-pay | Admitting: Internal Medicine

## 2024-07-14 DIAGNOSIS — R0609 Other forms of dyspnea: Secondary | ICD-10-CM | POA: Diagnosis not present

## 2024-07-14 LAB — RESPIRATORY PANEL BY PCR

## 2024-07-14 LAB — ECHOCARDIOGRAM COMPLETE
AR max vel: 2.83 cm2
AV Area VTI: 2.7 cm2
AV Area mean vel: 2.62 cm2
AV Mean grad: 6 mmHg
AV Peak grad: 10.9 mmHg
Ao pk vel: 1.65 m/s
Area-P 1/2: 5.66 cm2
Calc EF: 76.4 %
Height: 65 in
S' Lateral: 2.5 cm
Single Plane A2C EF: 75.9 %
Single Plane A4C EF: 72.3 %
Weight: 1657.86 [oz_av]

## 2024-07-14 LAB — CBC
HCT: 41.1 % (ref 36.0–46.0)
Hemoglobin: 13.6 g/dL (ref 12.0–15.0)
MCH: 32.7 pg (ref 26.0–34.0)
MCHC: 33.1 g/dL (ref 30.0–36.0)
MCV: 98.8 fL (ref 80.0–100.0)
Platelets: 305 K/uL (ref 150–400)
RBC: 4.16 MIL/uL (ref 3.87–5.11)
RDW: 13.7 % (ref 11.5–15.5)
WBC: 10.4 K/uL (ref 4.0–10.5)
nRBC: 0 % (ref 0.0–0.2)

## 2024-07-14 LAB — BLOOD GAS, ARTERIAL
Acid-Base Excess: 13.2 mmol/L — ABNORMAL HIGH (ref 0.0–2.0)
Bicarbonate: 42.5 mmol/L — ABNORMAL HIGH (ref 20.0–28.0)
O2 Content: 6 L/min
O2 Saturation: 99 %
Patient temperature: 37
pCO2 arterial: 77 mmHg (ref 32–48)
pH, Arterial: 7.35 (ref 7.35–7.45)
pO2, Arterial: 100 mmHg (ref 83–108)

## 2024-07-14 LAB — GLUCOSE, CAPILLARY
Glucose-Capillary: 127 mg/dL — ABNORMAL HIGH (ref 70–99)
Glucose-Capillary: 233 mg/dL — ABNORMAL HIGH (ref 70–99)
Glucose-Capillary: 131 mg/dL — ABNORMAL HIGH (ref 70–99)
Glucose-Capillary: 116 mg/dL — ABNORMAL HIGH (ref 70–99)

## 2024-07-14 LAB — BASIC METABOLIC PANEL WITH GFR
Anion gap: 11 (ref 5–15)
BUN: 39 mg/dL — ABNORMAL HIGH (ref 8–23)
CO2: 34 mmol/L — ABNORMAL HIGH (ref 22–32)
Calcium: 8.8 mg/dL — ABNORMAL LOW (ref 8.9–10.3)
Chloride: 95 mmol/L — ABNORMAL LOW (ref 98–111)
Creatinine, Ser: 0.53 mg/dL (ref 0.44–1.00)
GFR, Estimated: >60 mL/min
Glucose, Bld: 137 mg/dL — ABNORMAL HIGH (ref 70–99)
Potassium: 4 mmol/L (ref 3.5–5.1)
Sodium: 140 mmol/L (ref 135–145)

## 2024-07-14 LAB — MRSA NEXT GEN BY PCR, NASAL: MRSA by PCR Next Gen: NOT DETECTED

## 2024-07-14 LAB — PROCALCITONIN: Procalcitonin: 0.38 ng/mL

## 2024-07-14 LAB — HEMOGLOBIN A1C
Hgb A1c MFr Bld: 5.6 % (ref 4.8–5.6)
Mean Plasma Glucose: 114.02 mg/dL

## 2024-07-14 LAB — VITAMIN D 25 HYDROXY (VIT D DEFICIENCY, FRACTURES): Vit D, 25-Hydroxy: 10.3 ng/mL — ABNORMAL LOW (ref 30–100)

## 2024-07-14 MED ORDER — IPRATROPIUM-ALBUTEROL 0.5-2.5 (3) MG/3ML IN SOLN: 3.0000 mL | Freq: Four times a day (QID) | RESPIRATORY_TRACT | Status: DC | PRN

## 2024-07-14 MED ORDER — IPRATROPIUM-ALBUTEROL 0.5-2.5 (3) MG/3ML IN SOLN
3.0000 mL | Freq: Four times a day (QID) | RESPIRATORY_TRACT | Status: DC
  Administered 2024-07-14 – 2024-07-15 (×3): 3 mL via RESPIRATORY_TRACT
  Filled 2024-07-14 (×3): qty 3

## 2024-07-14 MED ORDER — PREDNISONE 20 MG PO TABS
40.0000 mg | ORAL_TABLET | Freq: Every day | ORAL | Status: DC
  Filled 2024-07-14: qty 2

## 2024-07-14 MED ORDER — ENSURE PLUS HIGH PROTEIN PO LIQD
237.0000 mL | Freq: Two times a day (BID) | ORAL | Status: DC
  Administered 2024-07-14 – 2024-07-22 (×9): 237 mL via ORAL

## 2024-07-14 MED ORDER — SODIUM CHLORIDE 0.9 % IV SOLN
500.0000 mg | INTRAVENOUS | Status: AC
  Administered 2024-07-14 – 2024-07-16 (×3): 500 mg via INTRAVENOUS
  Filled 2024-07-14 (×4): qty 5

## 2024-07-14 MED ORDER — VITAMIN D (ERGOCALCIFEROL) 1.25 MG (50000 UNIT) PO CAPS
50000.0000 [IU] | ORAL_CAPSULE | ORAL | Status: DC
  Administered 2024-07-21: 50000 [IU] via ORAL
  Filled 2024-07-14 (×2): qty 1

## 2024-07-14 NOTE — Progress Notes (Addendum)
 " PROGRESS NOTE    Traci Ballard  FMW:[id number] DOB: 07-11-1951 DOA: 07/13/2024 PCP: Tanda Katz, MD  Chief Complaint  Patient presents with   Shortness of Breath    Hospital Course:  Traci Ballard is a 73 y.o. year old female without known medical history presenting to the ED with worsening shortness of breath.  She states has been having shortness of breath for over a week.  She has been having some coughing with sputum production.  Denies any fever or chills but states her body is having difficulty with thermoregulation.  She states she does not have any medical problems and does not take any medications other than over-the-counter vitamin D  Patient admitted for acute hypoxic and hypercapnic respiratory failure, on BiPAP.  Admitted with suspected COPD exacerbation and acute heart failure.  Hospital course as below  Subjective: Patient was examined at the bedside, new to me today Off BiPAP she has been very drowsy/lethargic.  Reports she is allergic to prednisone and all steroids Repeat blood gas with elevated pCO2, placed her back on BiPAP Also refused heparin/Lovenox/SCD's, discussed the risk of VTE Reports lives alone and takes care of herself    Objective: Vitals:   07/14/24 0830 07/14/24 0845 07/14/24 0900 07/14/24 0915  BP:   123/72   Pulse: (!) 104 (!) 121 (!) 103 (!) 103  Resp: (!) 32 (!) 32 (!) 30 (!) 30  Temp:      TempSrc:      SpO2: 92% (!) 88% 93% 98%  Weight:      Height:        Intake/Output Summary (Last 24 hours) at 07/14/2024 1056 Last data filed at 07/14/2024 0654 Gross per 24 hour  Intake 350 ml  Output 600 ml  Net -250 ml   Filed Weights   07/13/24 1742 07/13/24 2247 07/14/24 0454  Weight: 43.3 kg 47 kg 47 kg    Examination: Gen: in respiratory distress HENT: on bipap CV: sinus rhythm and tachycardic rate, 2 + LE edema Lung: diminished breath sounds Abd: No TTP, normal bowel sounds MSK: No asymmetry, cachetic appearance,  poor foot hygiene.  Neuro: alert and oriented  Assessment & Plan:  Acute hypoxic and hypercapnic respiratory failure  Suspect multifactorial including CHF, COPD exacerbation Was placed PAP on BiPAP due to lethargy, blood gas with elevated pCO2 Wean as able  Acute heart failure BNP elevated CXR with trace bilateral pleural effusions Echo pending IV Lasix  40mg  bid Daily weight, monitor input/output  COPD exacerbation RVP negative CT PE negative for PE.  Diffuse peribronchovascular nodularity and bronchial wall thickening, consistent with airway inflammation On empiric coverage for multifocal pneumonia Denied steroids (states allergic), DuoNebs  Elevated troponin 26 -> 31, suspect due to demand ischemia Trend troponin  Severe vitamin D deficiency 50,000 units weekly  Diffuse osteopenia with severe anterior wedge compression deformities from T6-T9 and resultant severe kyphotic deformity Denies acute back pain  DVT prophylaxis: Refused pharmacological prophylaxis/SCDs   Code Status: Full Code Disposition:  TBD  Consultants:  None  Procedures:  None  Antimicrobials:  Anti-infectives (From admission, onward)    Start     Dose/Rate Route Frequency Ordered Stop   07/14/24 0130  azithromycin  (ZITHROMAX ) 500 mg in sodium chloride  0.9 % 250 mL IVPB        500 mg 250 mL/hr over 60 Minutes Intravenous Every 24 hours 07/14/24 0041 07/17/24 0129   07/14/24 0045  cefTRIAXone  (ROCEPHIN ) 2 g in sodium chloride  0.9 % 100  mL IVPB        2 g 200 mL/hr over 30 Minutes Intravenous Every 24 hours 07/13/24 2348         Data Reviewed: I have personally reviewed following labs and imaging studies CBC: Recent Labs  Lab 07/13/24 1747 07/14/24 0319  WBC 12.4* 10.4  NEUTROABS 9.2*  --   HGB 15.7* 13.6  HCT 47.8* 41.1  MCV 98.8 98.8  PLT 383 305   Basic Metabolic Panel: Recent Labs  Lab 07/13/24 1747 07/13/24 2307 07/14/24 0319  NA 137  --  140  K 3.5  --  4.0  CL 93*  --   95*  CO2 32  --  34*  GLUCOSE 146*  --  137*  BUN 38*  --  39*  CREATININE 0.59  --  0.53  CALCIUM 10.1  --  8.8*  MG  --  2.4  --    GFR: Estimated Creatinine Clearance: 45.1 mL/min (by C-G formula based on SCr of 0.53 mg/dL). Liver Function Tests: Recent Labs  Lab 07/13/24 1747  AST 19  ALT 21  ALKPHOS 128*  BILITOT 0.8  PROT 7.8  ALBUMIN 4.3   CBG: Recent Labs  Lab 07/13/24 2250 07/14/24 0723  GLUCAP 136* 127*    Recent Results (from the past 240 hours)  Resp panel by RT-PCR (RSV, Flu A&B, Covid) Anterior Nasal Swab     Status: None   Collection Time: 07/13/24  5:45 PM   Specimen: Anterior Nasal Swab  Result Value Ref Range Status   SARS Coronavirus 2 by RT PCR NEGATIVE NEGATIVE Final    Comment: (NOTE) SARS-CoV-2 target nucleic acids are NOT DETECTED.  The SARS-CoV-2 RNA is generally detectable in upper respiratory specimens during the acute phase of infection. The lowest concentration of SARS-CoV-2 viral copies this assay can detect is 138 copies/mL. A negative result does not preclude SARS-Cov-2 infection and should not be used as the sole basis for treatment or other patient management decisions. A negative result may occur with  improper specimen collection/handling, submission of specimen other than nasopharyngeal swab, presence of viral mutation(s) within the areas targeted by this assay, and inadequate number of viral copies(<138 copies/mL). A negative result must be combined with clinical observations, patient history, and epidemiological information. The expected result is Negative.  Fact Sheet for Patients:  bloggercourse.com  Fact Sheet for Healthcare Providers:  seriousbroker.it  This test is no t yet approved or cleared by the [place]  FDA and  has been authorized for detection and/or diagnosis of SARS-CoV-2 by FDA under an Emergency Use Authorization (EUA). This EUA will remain  in  effect (meaning this test can be used) for the duration of the COVID-19 declaration under Section 564(b)(1) of the Act, 21 U.S.C.section 360bbb-3(b)(1), unless the authorization is terminated  or revoked sooner.       Influenza A by PCR NEGATIVE NEGATIVE Final   Influenza B by PCR NEGATIVE NEGATIVE Final    Comment: (NOTE) The Xpert Xpress SARS-CoV-2/FLU/RSV plus assay is intended as an aid in the diagnosis of influenza from Nasopharyngeal swab specimens and should not be used as a sole basis for treatment. Nasal washings and aspirates are unacceptable for Xpert Xpress SARS-CoV-2/FLU/RSV testing.  Fact Sheet for Patients: bloggercourse.com  Fact Sheet for Healthcare Providers: seriousbroker.it  This test is not yet approved or cleared by the [place]  FDA and has been authorized for detection and/or diagnosis of SARS-CoV-2 by FDA under an Emergency Use Authorization (EUA). This EUA  will remain in effect (meaning this test can be used) for the duration of the COVID-19 declaration under Section 564(b)(1) of the Act, 21 U.S.C. section 360bbb-3(b)(1), unless the authorization is terminated or revoked.     Resp Syncytial Virus by PCR NEGATIVE NEGATIVE Final    Comment: (NOTE) Fact Sheet for Patients: bloggercourse.com  Fact Sheet for Healthcare Providers: seriousbroker.it  This test is not yet approved or cleared by the [place]  FDA and has been authorized for detection and/or diagnosis of SARS-CoV-2 by FDA under an Emergency Use Authorization (EUA). This EUA will remain in effect (meaning this test can be used) for the duration of the COVID-19 declaration under Section 564(b)(1) of the Act, 21 U.S.C. section 360bbb-3(b)(1), unless the authorization is terminated or revoked.  Performed at [facility], [address] [place], [place] [address]   MRSA Next  Gen by PCR, Nasal     Status: None   Collection Time: 07/13/24 10:53 PM   Specimen: Nasal Mucosa; Nasal Swab  Result Value Ref Range Status   MRSA by PCR Next Gen NOT DETECTED NOT DETECTED Final    Comment: (NOTE) The GeneXpert MRSA Assay (FDA approved for NASAL specimens only), is one component of a comprehensive MRSA colonization surveillance program. It is not intended to diagnose MRSA infection nor to guide or monitor treatment for MRSA infections. Test performance is not FDA approved in patients less than 71 years old. Performed at [facility], [address] [place], [place] [address]      Radiology Studies: CT Angio Chest PE W/Cm &/Or Wo Cm Result Date: 07/14/2024 EXAM: CTA CHEST 07/13/2024 09:08:56 PM TECHNIQUE: CTA of the chest was performed after the administration of intravenous contrast. Multiplanar reformatted images are provided for review. MIP images are provided for review. Automated exposure control, iterative reconstruction, and/or weight based adjustment of the mA/kV was utilized to reduce the radiation dose to as low as reasonably achievable. COMPARISON: None available. CLINICAL HISTORY: Pulmonary embolism, high probability. Dyspnea. FINDINGS: PULMONARY ARTERIES: Pulmonary arteries are suboptimally opacified. The central pulmonary arteries are of normal caliber. Evaluation is adequate only for exclusion of central pulmonary arterial emboli. The segmental and subsegmental pulmonary arteries are not well assessed on this examination, and peripheral pulmonary embolism cannot be excluded. MEDIASTINUM: Extensive coronary artery calcifications. Global cardiac size within normal limits. No pericardial effusion. Mild atherosclerotic calcification within the thoracic aorta. No aortic aneurysm. No aortic dissection, intramural hematoma, aortic ulceration, or rupture. LYMPH NODES: No mediastinal, hilar or axillary lymphadenopathy. LUNGS AND PLEURA: There is diffuse  peribronchovascular nodularity throughout the lungs demonstrating a mid and lower lung zone predominance. Associated bronchial wall thickening in keeping with airway inflammation. In the acute setting, this is most commonly seen with atypical infection including viral pneumonia. If chronic, chronic atypical mycobacterial or fungal infection or chronic hypersensitivity pneumonitis could result in this appearance. Right basilar subsegmental consolidation. No central obstructing lesion. The lungs are symmetrically hyperinflated in keeping with changes of underlying COPD. No pulmonary edema. No evidence of pleural effusion or pneumothorax. UPPER ABDOMEN: Limited images of the upper abdomen are unremarkable. SOFT TISSUES AND BONES: The osseous structures are diffusely osteopenic with severe anterior wedge compression deformities of T6 - T9 with resultant severe kyphotic deformity. No retropulsion. No definite acute bone abnormality identified. No acute soft tissue abnormality. IMPRESSION: 1. No central pulmonary embolism. Limited evaluation of segmental and subsegmental pulmonary emboli. 2. Diffuse peribronchovascular nodularity with mid and lower lung zone predominance and associated bronchial wall thickening,  consistent with airway inflammation. Differential includes atypical infection in the acute setting or chronic infection or hypersensitivity pneumonitis if chronic. 3. Right basilar subsegmental consolidation. 4. Symmetrically hyperinflated lungs, consistent with underlying COPD. 5. Diffuse osteopenia with severe anterior wedge compression deformities from T6-T9 and resultant severe kyphotic deformity, without retropulsion. 6. Extensive coronary artery calcifications. 7. Mild atherosclerotic calcification of the thoracic aorta. Electronically signed by: Dorethia Molt MD 07/14/2024 12:44 AM EST RP Workstation: [id number]   DG Chest Port 1 View Result Date: 07/13/2024 EXAM: 1 VIEW(S) XRAY OF THE CHEST 07/13/2024  06:00:00 PM COMPARISON: None available. CLINICAL HISTORY: shob, hypoxia FINDINGS: LUNGS AND PLEURA: Diffuse reticulonodular interstitial infiltrate, more purulent within the upper lung zones bilaterally, is new since prior examination and can be seen in the setting of atypical infection in the appropriate clinical setting. Trace bilateral pleural effusions are present. No pneumothorax. HEART AND MEDIASTINUM: No acute abnormality of the cardiac and mediastinal silhouettes. BONES AND SOFT TISSUES: Diffuse osteopenia. IMPRESSION: 1. Diffuse reticulonodular interstitial infiltrate with upper lung zone predominance bilaterally, possibly related to atypical infection. 2. Trace bilateral pleural effusions. 3. Diffuse osteopenia. Electronically signed by: Dorethia Molt MD 07/13/2024 08:25 PM EST RP Workstation: [id number]    Scheduled Meds:  Chlorhexidine  Gluconate Cloth  6 each Topical QHS   feeding supplement  237 mL Oral BID BM   furosemide   40 mg Intravenous BID   heparin  5,000 Units Subcutaneous Q8H   mouth rinse  15 mL Mouth Rinse 4 times per day   predniSONE  40 mg Oral Q breakfast   sodium chloride  flush  3 mL Intravenous Q12H   Continuous Infusions:  azithromycin  Stopped (07/14/24 0242)   cefTRIAXone  (ROCEPHIN )  IV Stopped (07/14/24 0108)     LOS: 1 day  MDM: Patient is high risk for one or more organ failure.  They necessitate ongoing hospitalization for continued IV therapies and subsequent lab monitoring. Total time spent interpreting labs and vitals, reviewing the medical record, coordinating care amongst consultants and care team members, directly assessing and discussing care with the patient and/or family: 55 min Laree Lock, MD [facility]  To contact the attending physician between 7A-7P please use Epic Chat. To contact the covering physician during after hours 7P-7A, please review Amion.  07/14/2024, 10:56 AM   *This document has been created with the assistance of  dictation software. Please excuse typographical errors. *   "

## 2024-07-14 NOTE — Plan of Care (Signed)
   Problem: Clinical Measurements: Goal: Ability to maintain clinical measurements within normal limits will improve Outcome: Progressing Goal: Will remain free from infection Outcome: Progressing Goal: Diagnostic test results will improve Outcome: Progressing Goal: Respiratory complications will improve Outcome: Progressing

## 2024-07-14 NOTE — Progress Notes (Signed)
 OT Cancellation Note  Patient Details Name: Traci Ballard MRN: [id number] DOB: September 24, 1950   Cancelled Treatment:    Reason Eval/Treat Not Completed: Fatigue/lethargy limiting ability to participate. Chart reviewed, on arrival RN reports pt with increased O2 needs and lethargy/fatigue. Requesting to hold, will re-attempt next date as pt appropriate.   Elston Slot, M.S. OTR/L  07/14/2024, 1:12 PM  ascom [phone]

## 2024-07-14 NOTE — TOC Initial Note (Signed)
 Transition of Care [facility]) - Initial/Assessment Note    Patient Details  Name: Traci Ballard MRN: [id number] Date of Birth: 08/03/1950  [facility]) CM/SW Contact:    Corrie JINNY Ruts, LCSW Phone Number: 07/14/2024, 11:04 AM  Clinical Narrative:                 Chart reviewed. Received consult for SNF placement. I was able to speak with the patient at bed side today. I introduced myself, my role, and reason for consult. The patient reports that she is doing well. The patient was unable to remember who her PCP was. I reviewed the chart and the patient PCP is Morene Blush.   The patient reports that she lives in the home by herself. The patient reports that she was able to do thing independently. The patient reports that she drives herself to medical appointments. The patient reports that her daughter or Stephane Matsu will assist her during D/C.   The patient reports that she uses [facility] for a pharmacy. The patient reports that she has never had HH or been admitted into a SNF in the past. The patient reports that she has not equipment in the home. I informed the patient about SNF recommendation. The patient declined SNF placement and HH agency. The patient reports that she would like to go home.   I ensured the patient to reach out to SW if thoughts has changed. The patient verbalized understanding. Patient has no questions or concerns during the assessment.     Barriers to Discharge: Continued Medical Work up   Patient Goals and CMS Choice            Expected Discharge Plan and Services       Living arrangements for the past 2 months: Single Family Home                                      Prior Living Arrangements/Services Living arrangements for the past 2 months: Single Family Home Lives with:: Self Patient language and need for interpreter reviewed:: Yes        Need for Family Participation in Patient Care: Yes (Comment)     Criminal  Activity/Legal Involvement Pertinent to Current Situation/Hospitalization: No - Comment as needed  Activities of Daily Living   ADL Screening (condition at time of admission) Independently performs ADLs?: Yes (appropriate for developmental age) Is the patient deaf or have difficulty hearing?: No Does the patient have difficulty seeing, even when wearing glasses/contacts?: Yes Does the patient have difficulty concentrating, remembering, or making decisions?: No  Permission Sought/Granted                  Emotional Assessment Appearance:: Appears stated age Attitude/Demeanor/Rapport: Gracious Affect (typically observed): Calm Orientation: : Oriented to Place, Oriented to Self, Oriented to Situation, Oriented to  Time Alcohol / Substance Use: Not Applicable Psych Involvement: No (comment)  Admission diagnosis:  Acute respiratory failure with hypoxia and hypercapnia (HCC) [J96.01, J96.02] Patient Active Problem List   Diagnosis Date Noted   Acute respiratory failure with hypoxia and hypercapnia (HCC) 07/13/2024   Elevated blood pressure reading 07/13/2024   PCP:  Blush Morene, MD Pharmacy:   [facility] [phone] - [place], [place] - [address] AT [address] [place] [address] Phone: [phone] Fax: [phone]     Social Drivers of Health (SDOH) Social History:  SDOH Screenings   Food Insecurity: Patient Unable To Answer (07/13/2024)  Housing: Unknown (07/13/2024)  Transportation Needs: Patient Unable To Answer (07/13/2024)  Utilities: Patient Unable To Answer (07/13/2024)  Social Connections: Patient Unable To Answer (07/13/2024)  Tobacco Use: Medium Risk (07/14/2024)   SDOH Interventions:     Readmission Risk Interventions     No data to display

## 2024-07-14 NOTE — Progress Notes (Signed)
 Patient has been refusing her subcutaneous heparin for VTE. Made MD aware. Asked patient about taking Lovenox she again refused and said she did not want to take a blood thinner. Then asked patient about SCDs and she again refused. Educated the patient on the importance of blood clot prevention while in the hospital. Made MD aware of same.

## 2024-07-14 NOTE — Progress Notes (Signed)
 Patient transitioned from bi-pap to [facility] this morning. Later patient became lethargic and unable to stay awake. Made MD aware, new order for ABG. Tim with RT called to report a ciritical pCo2 77. Made MD aware New orders for patient to go back on bi-pap.

## 2024-07-14 NOTE — Progress Notes (Signed)
 Patient found by RN to be lethargic.  ABG obtained and CO2 was 77.  Patient was returned to BiPAP.

## 2024-07-14 NOTE — Evaluation (Signed)
 Physical Therapy Evaluation Patient Details Name: Traci Ballard MRN: [id number] DOB: 09-09-1950 Today's Date: 07/14/2024  History of Present Illness  Patient is a 73 year old female with acute respiratory failure with hypoxia and hypercapnia. No known medical history.  Clinical Impression  Patient is agreeable to PT evaluation. She reports she lives at home alone with limited caregiver support available. She sleeps in a recliner chair but reports being independent and driving at baseline.  Today the patient needs assistance for bed mobility. Sitting balance is fair. Patient is fatigued with minimal activity with increased respiration rate. Sp02 89-90% on 5 L 02 with activity. The patient does not appear to be at her baseline level of functional independence. Recommend to continue PT while in the hospital. Patient may need PT follow up after discharge pending progress.       If plan is discharge home, recommend the following: A little help with walking and/or transfers;A little help with bathing/dressing/bathroom;Help with stairs or ramp for entrance;Assist for transportation;Assistance with cooking/housework   Can travel by private vehicle   No    Equipment Recommendations  (to be determined)  Recommendations for Other Services       Functional Status Assessment Patient has had a recent decline in their functional status and demonstrates the ability to make significant improvements in function in a reasonable and predictable amount of time.     Precautions / Restrictions Precautions Precautions: Fall Restrictions Weight Bearing Restrictions Per Provider Order: No      Mobility  Bed Mobility Overal bed mobility: Needs Assistance Bed Mobility: Sit to Supine, Supine to Sit     Supine to sit: Contact guard Sit to supine: Contact guard assist   General bed mobility comments: cues for technique.    Transfers Overall transfer level: Needs assistance   Transfers: Bed  to chair/wheelchair/BSC            Lateral/Scoot Transfers: Min assist General transfer comment: lateral scooting x 2 bouts. activity tolerance limited by fatigue. standing not attempted due to respiratory status with Sp02 89-90% on 5 L HFNC with activity. respiration rate intermittently elevated with activity    Ambulation/Gait               General Gait Details: not attempted  Stairs            Wheelchair Mobility     Tilt Bed    Modified Rankin (Stroke Patients Only)       Balance Overall balance assessment: Needs assistance Sitting-balance support: Feet supported Sitting balance-Leahy Scale: Fair                                       Pertinent Vitals/Pain Pain Assessment Pain Assessment: No/denies pain    Home Living Family/patient expects to be discharged to:: Private residence Living Arrangements: Alone Available Help at Discharge: Neighbor (help is minimal per patient report) Type of Home: House Home Access: Stairs to enter   [employer] of Steps: unsure   Home Layout: Two level;Able to live on main level with bedroom/bathroom   Additional Comments: patient reports she sleeps in a recliner    Prior Function Prior Level of Function : Independent/Modified Independent;Driving             Mobility Comments: independent ADLs Comments: independent     Extremity/Trunk Assessment   Upper Extremity Assessment Upper Extremity Assessment: Generalized weakness  Lower Extremity Assessment Lower Extremity Assessment: Generalized weakness    Cervical / Trunk Assessment Cervical / Trunk Assessment: Kyphotic  Communication   Communication Communication: No apparent difficulties    Cognition Arousal: Alert Behavior During Therapy: Anxious   PT - Cognitive impairments: No family/caregiver present to determine baseline, Orientation, Memory   Orientation impairments: Time                   PT -  Cognition Comments: patient is cooperative. she is a poor historian and tangential at times Following commands: Impaired Following commands impaired: Follows one step commands with increased time     Cueing Cueing Techniques: Verbal cues, Tactile cues     General Comments      Exercises     Assessment/Plan    PT Assessment Patient needs continued PT services  PT Problem List Decreased strength;Decreased range of motion;Decreased activity tolerance;Decreased balance;Decreased mobility;Cardiopulmonary status limiting activity;Decreased safety awareness       PT Treatment Interventions DME instruction;Gait training;Stair training;Functional mobility training;Therapeutic activities;Therapeutic exercise;Balance training;Neuromuscular re-education;Cognitive remediation;Patient/family education    PT Goals (Current goals can be found in the Care Plan section)  Acute Rehab PT Goals Patient Stated Goal: to feel better PT Goal Formulation: With patient Time For Goal Achievement: 07/28/24 Potential to Achieve Goals: Fair    Frequency Min 2X/week     Co-evaluation               AM-PAC PT 6 Clicks Mobility  Outcome Measure Help needed turning from your back to your side while in a flat bed without using bedrails?: None Help needed moving from lying on your back to sitting on the side of a flat bed without using bedrails?: A Little Help needed moving to and from a bed to a chair (including a wheelchair)?: A Little Help needed standing up from a chair using your arms (e.g., wheelchair or bedside chair)?: A Little Help needed to walk in hospital room?: A Little Help needed climbing 3-5 steps with a railing? : A Lot 6 Click Score: 18    End of Session Equipment Utilized During Treatment: Oxygen Activity Tolerance: Patient limited by fatigue Patient left: in bed;with call bell/phone within reach;with bed alarm set Nurse Communication: Mobility status PT Visit Diagnosis: Muscle  weakness (generalized) (M62.81);Unsteadiness on feet (R26.81)    Time: 9166-9151 PT Time Calculation (min) (ACUTE ONLY): 15 min   Charges:   PT Evaluation $PT Eval Moderate Complexity: 1 Mod   PT General Charges $$ ACUTE PT VISIT: 1 Visit         Randine Essex, PT, MPT  Randine LULLA Essex 07/14/2024, 10:15 AM

## 2024-07-14 NOTE — Progress Notes (Signed)
 Patient removed form BiPAP at 0750.  Placed on high-flow nasal cannula at 5 LPM and is tolerating well with a saturation of 93%.
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
## 2024-07-15 ENCOUNTER — Telehealth (HOSPITAL_BASED_OUTPATIENT_CLINIC_OR_DEPARTMENT_OTHER): Payer: Self-pay | Admitting: *Deleted

## 2024-07-15 DIAGNOSIS — J9601 Acute respiratory failure with hypoxia: Secondary | ICD-10-CM | POA: Diagnosis not present

## 2024-07-15 DIAGNOSIS — J9602 Acute respiratory failure with hypercapnia: Secondary | ICD-10-CM | POA: Diagnosis not present

## 2024-07-15 LAB — BASIC METABOLIC PANEL WITH GFR
Anion gap: 8 (ref 5–15)
BUN: 46 mg/dL — ABNORMAL HIGH (ref 8–23)
CO2: 41 mmol/L — ABNORMAL HIGH (ref 22–32)
Calcium: 9.2 mg/dL (ref 8.9–10.3)
Chloride: 98 mmol/L (ref 98–111)
Creatinine, Ser: 0.6 mg/dL (ref 0.44–1.00)
GFR, Estimated: >60 mL/min
Glucose, Bld: 118 mg/dL — ABNORMAL HIGH (ref 70–99)
Potassium: 3.9 mmol/L (ref 3.5–5.1)
Sodium: 147 mmol/L — ABNORMAL HIGH (ref 135–145)

## 2024-07-15 LAB — CBC
HCT: 42.2 % (ref 36.0–46.0)
Hemoglobin: 13.4 g/dL (ref 12.0–15.0)
MCH: 32.4 pg (ref 26.0–34.0)
MCHC: 31.8 g/dL (ref 30.0–36.0)
MCV: 102.2 fL — ABNORMAL HIGH (ref 80.0–100.0)
Platelets: 312 K/uL (ref 150–400)
RBC: 4.13 MIL/uL (ref 3.87–5.11)
RDW: 14 % (ref 11.5–15.5)
WBC: 12.4 K/uL — ABNORMAL HIGH (ref 4.0–10.5)
nRBC: 0 % (ref 0.0–0.2)

## 2024-07-15 LAB — EXPECTORATED SPUTUM ASSESSMENT W GRAM STAIN, RFLX TO RESP C

## 2024-07-15 LAB — STREP PNEUMONIAE URINARY ANTIGEN: Strep Pneumo Urinary Antigen: NEGATIVE

## 2024-07-15 LAB — GLUCOSE, CAPILLARY
Glucose-Capillary: 116 mg/dL — ABNORMAL HIGH (ref 70–99)
Glucose-Capillary: 106 mg/dL — ABNORMAL HIGH (ref 70–99)
Glucose-Capillary: 229 mg/dL — ABNORMAL HIGH (ref 70–99)

## 2024-07-15 LAB — MAGNESIUM: Magnesium: 2.7 mg/dL — ABNORMAL HIGH (ref 1.7–2.4)

## 2024-07-15 MED ORDER — ENOXAPARIN SODIUM 40 MG/0.4ML IJ SOSY
40.0000 mg | PREFILLED_SYRINGE | INTRAMUSCULAR | Status: DC
  Administered 2024-07-16 – 2024-07-22 (×7): 40 mg via SUBCUTANEOUS
  Filled 2024-07-15 (×7): qty 0.4

## 2024-07-15 MED ORDER — IPRATROPIUM-ALBUTEROL 0.5-2.5 (3) MG/3ML IN SOLN: 3.0000 mL | Freq: Two times a day (BID) | RESPIRATORY_TRACT | Status: DC

## 2024-07-15 MED ORDER — ADULT MULTIVITAMIN W/MINERALS CH
1.0000 | ORAL_TABLET | Freq: Every day | ORAL | Status: DC
  Administered 2024-07-16 – 2024-07-22 (×7): 1 via ORAL
  Filled 2024-07-15 (×8): qty 1

## 2024-07-15 MED ORDER — IPRATROPIUM-ALBUTEROL 0.5-2.5 (3) MG/3ML IN SOLN
3.0000 mL | Freq: Four times a day (QID) | RESPIRATORY_TRACT | Status: DC
  Administered 2024-07-15 – 2024-07-16 (×6): 3 mL via RESPIRATORY_TRACT
  Filled 2024-07-15 (×6): qty 3

## 2024-07-15 MED ORDER — METHYLPREDNISOLONE SODIUM SUCC 125 MG IJ SOLR
80.0000 mg | Freq: Once | INTRAMUSCULAR | Status: AC
  Administered 2024-07-15: 80 mg via INTRAVENOUS
  Filled 2024-07-15: qty 2

## 2024-07-15 MED ORDER — SODIUM CHLORIDE 0.9 % IV SOLN
1.0000 g | INTRAVENOUS | Status: DC
  Administered 2024-07-16 – 2024-07-17 (×2): 1 g via INTRAVENOUS
  Filled 2024-07-15 (×3): qty 10

## 2024-07-15 MED ORDER — INSULIN ASPART 100 UNIT/ML IJ SOLN
2.0000 [IU] | Freq: Once | INTRAMUSCULAR | Status: AC
  Administered 2024-07-15: 2 [IU] via SUBCUTANEOUS
  Filled 2024-07-15: qty 2

## 2024-07-15 MED ORDER — PREDNISONE 20 MG PO TABS
40.0000 mg | ORAL_TABLET | Freq: Every day | ORAL | Status: DC
  Administered 2024-07-16 – 2024-07-17 (×2): 40 mg via ORAL
  Filled 2024-07-15 (×2): qty 2

## 2024-07-15 NOTE — Progress Notes (Signed)
 " PROGRESS NOTE    Traci Ballard  FMW:[id number] DOB: 07/19/1950 DOA: 07/13/2024 PCP: Tanda Katz, MD  Chief Complaint  Patient presents with   Shortness of Breath    Hospital Course:  Traci Ballard is a 73 y.o. year old female without known medical history presenting to the ED with worsening shortness of breath.  She states has been having shortness of breath for over a week.  She has been having some coughing with sputum production.  Denies any fever or chills but states her body is having difficulty with thermoregulation.  She states she does not have any medical problems and does not take any medications other than over-the-counter vitamin D  Patient admitted for acute hypoxic and hypercapnic respiratory failure, on BiPAP.  Admitted with suspected COPD exacerbation and acute heart failure.  Hospital course as below  Subjective: Breathing comfortably off bipap    Objective: Vitals:   07/15/24 0900 07/15/24 1000 07/15/24 1100 07/15/24 1200  BP: 116/75 117/63 99/83 123/69  Pulse:      Resp: 18 (!) 22 (!) 22 (!) 21  Temp:      TempSrc:      SpO2:      Weight:      Height:        Intake/Output Summary (Last 24 hours) at 07/15/2024 1258 Last data filed at 07/15/2024 0600 Gross per 24 hour  Intake 350 ml  Output 500 ml  Net -150 ml   Filed Weights   07/13/24 2247 07/14/24 0454 07/15/24 0500  Weight: 47 kg 47 kg 47.6 kg    Examination: Gen: nad CV: sinus rhythm and tachycardic rate, 2 + LE edema Lung: diminished breath sounds, faint exp wheeze Abd: No TTP, normal bowel sounds MSK: No asymmetry, cachetic appearance  Neuro: alert and oriented  Assessment & Plan:  Acute hypoxic and hypercapnic respiratory failure  2/2 copd exacerbation, possible pna. Weaned off bipap this afternoon, will see if tolerates  Acute heart failure Tte with mod mr/tr, preserved ef, think copd primary driver of sympotms - d/c lasix   COPD exacerbation No formal diagnosis  but symptoms, ct scan, and smoking history suggestive RVP negative CT PE negative for PE.  Diffuse peribronchovascular nodularity and bronchial wall thickening, consistent with airway inflammation On empiric coverage for multifocal pneumonia Agreeable to steroids today Check sputum for culture, urine antigens Continue ceftriaxone /azithromycin   Elevated troponin 26 -> 31, suspect due to demand ischemia Trend troponin  Severe vitamin D deficiency 50,000 units weekly  Diffuse osteopenia with severe anterior wedge compression deformities from T6-T9 and resultant severe kyphotic deformity Denies acute back pain  Debility PT advising snf. Toc consulted  Advance care planning 10 min today, full code  DVT prophylaxis: Refused pharmacological prophylaxis/SCDs   Code Status: Full Code Disposition:  TBD Family: daughter updated @ bedside 12/31  Consultants:  None  Procedures:  None  Antimicrobials:  Anti-infectives (From admission, onward)    Start     Dose/Rate Route Frequency Ordered Stop   07/14/24 0130  azithromycin  (ZITHROMAX ) 500 mg in sodium chloride  0.9 % 250 mL IVPB        500 mg 250 mL/hr over 60 Minutes Intravenous Every 24 hours 07/14/24 0041 07/17/24 0459   07/14/24 0045  cefTRIAXone  (ROCEPHIN ) 2 g in sodium chloride  0.9 % 100 mL IVPB        2 g 200 mL/hr over 30 Minutes Intravenous Every 24 hours 07/13/24 2348         Data Reviewed:  I have personally reviewed following labs and imaging studies CBC: Recent Labs  Lab 07/13/24 1747 07/14/24 0319 07/15/24 0247  WBC 12.4* 10.4 12.4*  NEUTROABS 9.2*  --   --   HGB 15.7* 13.6 13.4  HCT 47.8* 41.1 42.2  MCV 98.8 98.8 102.2*  PLT 383 305 312   Basic Metabolic Panel: Recent Labs  Lab 07/13/24 1747 07/13/24 2307 07/14/24 0319 07/15/24 0247  NA 137  --  140 147*  K 3.5  --  4.0 3.9  CL 93*  --  95* 98  CO2 32  --  34* 41*  GLUCOSE 146*  --  137* 118*  BUN 38*  --  39* 46*  CREATININE 0.59  --  0.53  0.60  CALCIUM 10.1  --  8.8* 9.2  MG  --  2.4  --  2.7*   GFR: Estimated Creatinine Clearance: 45.7 mL/min (by C-G formula based on SCr of 0.6 mg/dL). Liver Function Tests: Recent Labs  Lab 07/13/24 1747  AST 19  ALT 21  ALKPHOS 128*  BILITOT 0.8  PROT 7.8  ALBUMIN 4.3   CBG: Recent Labs  Lab 07/14/24 1150 07/14/24 1542 07/14/24 2259 07/15/24 0745 07/15/24 1127  GLUCAP 233* 131* 116* 116* 106*    Recent Results (from the past 240 hours)  Resp panel by RT-PCR (RSV, Flu A&B, Covid) Anterior Nasal Swab     Status: None   Collection Time: 07/13/24  5:45 PM   Specimen: Anterior Nasal Swab  Result Value Ref Range Status   SARS Coronavirus 2 by RT PCR NEGATIVE NEGATIVE Final    Comment: (NOTE) SARS-CoV-2 target nucleic acids are NOT DETECTED.  The SARS-CoV-2 RNA is generally detectable in upper respiratory specimens during the acute phase of infection. The lowest concentration of SARS-CoV-2 viral copies this assay can detect is 138 copies/mL. A negative result does not preclude SARS-Cov-2 infection and should not be used as the sole basis for treatment or other patient management decisions. A negative result may occur with  improper specimen collection/handling, submission of specimen other than nasopharyngeal swab, presence of viral mutation(s) within the areas targeted by this assay, and inadequate number of viral copies(<138 copies/mL). A negative result must be combined with clinical observations, patient history, and epidemiological information. The expected result is Negative.  Fact Sheet for Patients:  bloggercourse.com  Fact Sheet for Healthcare Providers:  seriousbroker.it  This test is no t yet approved or cleared by the [place]  FDA and  has been authorized for detection and/or diagnosis of SARS-CoV-2 by FDA under an Emergency Use Authorization (EUA). This EUA will remain  in effect (meaning this test  can be used) for the duration of the COVID-19 declaration under Section 564(b)(1) of the Act, 21 U.S.C.section 360bbb-3(b)(1), unless the authorization is terminated  or revoked sooner.       Influenza A by PCR NEGATIVE NEGATIVE Final   Influenza B by PCR NEGATIVE NEGATIVE Final    Comment: (NOTE) The Xpert Xpress SARS-CoV-2/FLU/RSV plus assay is intended as an aid in the diagnosis of influenza from Nasopharyngeal swab specimens and should not be used as a sole basis for treatment. Nasal washings and aspirates are unacceptable for Xpert Xpress SARS-CoV-2/FLU/RSV testing.  Fact Sheet for Patients: bloggercourse.com  Fact Sheet for Healthcare Providers: seriousbroker.it  This test is not yet approved or cleared by the [place]  FDA and has been authorized for detection and/or diagnosis of SARS-CoV-2 by FDA under an Emergency Use Authorization (EUA). This EUA will remain  in effect (meaning this test can be used) for the duration of the COVID-19 declaration under Section 564(b)(1) of the Act, 21 U.S.C. section 360bbb-3(b)(1), unless the authorization is terminated or revoked.     Resp Syncytial Virus by PCR NEGATIVE NEGATIVE Final    Comment: (NOTE) Fact Sheet for Patients: bloggercourse.com  Fact Sheet for Healthcare Providers: seriousbroker.it  This test is not yet approved or cleared by the [place]  FDA and has been authorized for detection and/or diagnosis of SARS-CoV-2 by FDA under an Emergency Use Authorization (EUA). This EUA will remain in effect (meaning this test can be used) for the duration of the COVID-19 declaration under Section 564(b)(1) of the Act, 21 U.S.C. section 360bbb-3(b)(1), unless the authorization is terminated or revoked.  Performed at [facility], [address] [place], [place] [address]   MRSA Next Gen by PCR, Nasal      Status: None   Collection Time: 07/13/24 10:53 PM   Specimen: Nasal Mucosa; Nasal Swab  Result Value Ref Range Status   MRSA by PCR Next Gen NOT DETECTED NOT DETECTED Final    Comment: (NOTE) The GeneXpert MRSA Assay (FDA approved for NASAL specimens only), is one component of a comprehensive MRSA colonization surveillance program. It is not intended to diagnose MRSA infection nor to guide or monitor treatment for MRSA infections. Test performance is not FDA approved in patients less than 35 years old. Performed at [facility], [address] [place], [place] [address]   Respiratory (~20 pathogens) panel by PCR     Status: None   Collection Time: 07/14/24 12:09 PM   Specimen: Nasopharyngeal Swab; Respiratory  Result Value Ref Range Status   Adenovirus NOT DETECTED NOT DETECTED Final   Coronavirus 229E NOT DETECTED NOT DETECTED Final    Comment: (NOTE) The Coronavirus on the Respiratory Panel, DOES NOT test for the novel  Coronavirus (2019 nCoV)    Coronavirus HKU1 NOT DETECTED NOT DETECTED Final   Coronavirus NL63 NOT DETECTED NOT DETECTED Final   Coronavirus OC43 NOT DETECTED NOT DETECTED Final   Metapneumovirus NOT DETECTED NOT DETECTED Final   Rhinovirus / Enterovirus NOT DETECTED NOT DETECTED Final   Influenza A NOT DETECTED NOT DETECTED Final   Influenza B NOT DETECTED NOT DETECTED Final   Parainfluenza Virus 1 NOT DETECTED NOT DETECTED Final   Parainfluenza Virus 2 NOT DETECTED NOT DETECTED Final   Parainfluenza Virus 3 NOT DETECTED NOT DETECTED Final   Parainfluenza Virus 4 NOT DETECTED NOT DETECTED Final   Respiratory Syncytial Virus NOT DETECTED NOT DETECTED Final   Bordetella pertussis NOT DETECTED NOT DETECTED Final   Bordetella Parapertussis NOT DETECTED NOT DETECTED Final   Chlamydophila pneumoniae NOT DETECTED NOT DETECTED Final   Mycoplasma pneumoniae NOT DETECTED NOT DETECTED Final    Comment: Performed at [facility], [address]  [place], [place] [address]     Radiology Studies: ECHOCARDIOGRAM COMPLETE Result Date: 07/14/2024    ECHOCARDIOGRAM REPORT   Patient Name:   Traci Ballard [facility] Date of Exam: 07/14/2024 Medical Rec #:  [id number]             Height:       65.0 in Accession #:    [id number]            Weight:       103.6 lb Date of Birth:  1951/06/07             BSA:  1.496 m Patient Age:    73 years              BP:           116/61 mmHg Patient Gender: F                     HR:           120 bpm. Exam Location:  [facility] Procedure: 2D Echo, Cardiac Doppler and Color Doppler (Both Spectral and Color            Flow Doppler were utilized during procedure). Indications:     Dyspnea R06.00  History:         Patient has no prior history of Echocardiogram examinations.                  Signs/Symptoms:Dyspnea.  Sonographer:     Ashley McNeely-Sloane Referring Phys:  [id number] [facility] Diagnosing Phys: Lonni Hanson MD IMPRESSIONS  1. Left ventricular ejection fraction, by estimation, is 65 to 70%. The left ventricle has normal function. The left ventricle has no regional wall motion abnormalities. There is mild left ventricular hypertrophy. Left ventricular diastolic parameters are indeterminate.  2. Right ventricular systolic function is normal. The right ventricular size is normal.  3. The mitral valve is degenerative. Mild to moderate mitral valve regurgitation.  4. Tricuspid valve regurgitation is moderate.  5. The aortic valve is tricuspid. Aortic valve regurgitation is not visualized. No aortic stenosis is present. FINDINGS  Left Ventricle: Left ventricular ejection fraction, by estimation, is 65 to 70%. The left ventricle has normal function. The left ventricle has no regional wall motion abnormalities. The left ventricular internal cavity size was normal in size. There is  mild left ventricular hypertrophy. Left ventricular diastolic parameters are indeterminate. Right Ventricle: The right ventricular size is normal. No  increase in right ventricular wall thickness. Right ventricular systolic function is normal. Left Atrium: Left atrial size was normal in size. Right Atrium: Right atrial size was normal in size. Pericardium: The pericardium was not well visualized. Mitral Valve: The mitral valve is degenerative in appearance. There is mild calcification of the mitral valve leaflet(s). Mild to moderate mitral annular calcification. Mild to moderate mitral valve regurgitation. Tricuspid Valve: The tricuspid valve is grossly normal. Tricuspid valve regurgitation is moderate. Aortic Valve: The aortic valve is tricuspid. Aortic valve regurgitation is not visualized. No aortic stenosis is present. Aortic valve mean gradient measures 6.0 mmHg. Aortic valve peak gradient measures 10.9 mmHg. Aortic valve area, by VTI measures 2.70  cm. Pulmonic Valve: The pulmonic valve was not well visualized. Pulmonic valve regurgitation is mild. No evidence of pulmonic stenosis. Aorta: The aortic root and ascending aorta are structurally normal, with no evidence of dilitation. Pulmonary Artery: The pulmonary artery is not well seen. Venous: The inferior vena cava was not well visualized. IAS/Shunts: The interatrial septum was not well visualized.  LEFT VENTRICLE PLAX 2D LVIDd:         3.60 cm     Diastology LVIDs:         2.50 cm     LV e' medial:    8.95 cm/s LV PW:         1.10 cm     LV E/e' medial:  11.1 LV IVS:        1.10 cm     LV e' lateral:   7.65 cm/s LVOT diam:     2.10 cm  LV E/e' lateral: 12.9 LV SV:         63 LV SV Index:   42 LVOT Area:     3.46 cm LV IVRT:       102 msec  LV Volumes (MOD) LV vol d, MOD A2C: 54.4 ml LV vol d, MOD A4C: 58.2 ml LV vol s, MOD A2C: 13.1 ml LV vol s, MOD A4C: 16.1 ml LV SV MOD A2C:     41.3 ml LV SV MOD A4C:     58.2 ml LV SV MOD BP:      44.1 ml RIGHT VENTRICLE RV Basal diam:  3.80 cm RV Mid diam:    3.40 cm RV S prime:     18.80 cm/s TAPSE (M-mode): 2.9 cm LEFT ATRIUM           Index        RIGHT ATRIUM            Index LA diam:      2.70 cm 1.81 cm/m   RA Area:     13.40 cm LA Vol (A2C): 46.3 ml 30.95 ml/m  RA Volume:   34.70 ml  23.20 ml/m LA Vol (A4C): 20.9 ml 13.97 ml/m  AORTIC VALVE                     PULMONIC VALVE AV Area (Vmax):    2.83 cm      PV Vmax:        0.91 m/s AV Area (Vmean):   2.62 cm      PV Vmean:       62.600 cm/s AV Area (VTI):     2.70 cm      PV VTI:         0.129 m AV Vmax:           165.00 cm/s   PV Peak grad:   3.3 mmHg AV Vmean:          114.000 cm/s  PV Mean grad:   2.0 mmHg AV VTI:            0.232 m       RVOT Peak grad: 1 mmHg AV Peak Grad:      10.9 mmHg AV Mean Grad:      6.0 mmHg LVOT Vmax:         135.00 cm/s LVOT Vmean:        86.200 cm/s LVOT VTI:          0.181 m LVOT/AV VTI ratio: 0.78  AORTA Ao Root diam: 2.80 cm Ao Asc diam:  2.60 cm MITRAL VALVE                TRICUSPID VALVE MV Area (PHT): 5.66 cm     TV Peak grad:   10.0 mmHg MV Decel Time: 134 msec     TV Mean grad:   6.0 mmHg MV E velocity: 99.00 cm/s   TV Vmax:        1.58 m/s MV A velocity: 127.00 cm/s  TV Vmean:       122.0 cm/s MV E/A ratio:  0.78         TV VTI:         0.25 msec                             TR Peak grad:   30.9 mmHg  TR Mean grad:   26.0 mmHg                             TR Vmax:        278.00 cm/s                             TR Vmean:       254.0 cm/s                              SHUNTS                             Systemic VTI:  0.18 m                             Systemic Diam: 2.10 cm                             Pulmonic VTI:  0.067 m Lonni Hanson MD Electronically signed by Lonni Hanson MD Signature Date/Time: 07/14/2024/7:01:01 PM    Final    CT Angio Chest PE W/Cm &/Or Wo Cm Result Date: 07/14/2024 EXAM: CTA CHEST 07/13/2024 09:08:56 PM TECHNIQUE: CTA of the chest was performed after the administration of intravenous contrast. Multiplanar reformatted images are provided for review. MIP images are provided for review. Automated exposure control,  iterative reconstruction, and/or weight based adjustment of the mA/kV was utilized to reduce the radiation dose to as low as reasonably achievable. COMPARISON: None available. CLINICAL HISTORY: Pulmonary embolism, high probability. Dyspnea. FINDINGS: PULMONARY ARTERIES: Pulmonary arteries are suboptimally opacified. The central pulmonary arteries are of normal caliber. Evaluation is adequate only for exclusion of central pulmonary arterial emboli. The segmental and subsegmental pulmonary arteries are not well assessed on this examination, and peripheral pulmonary embolism cannot be excluded. MEDIASTINUM: Extensive coronary artery calcifications. Global cardiac size within normal limits. No pericardial effusion. Mild atherosclerotic calcification within the thoracic aorta. No aortic aneurysm. No aortic dissection, intramural hematoma, aortic ulceration, or rupture. LYMPH NODES: No mediastinal, hilar or axillary lymphadenopathy. LUNGS AND PLEURA: There is diffuse peribronchovascular nodularity throughout the lungs demonstrating a mid and lower lung zone predominance. Associated bronchial wall thickening in keeping with airway inflammation. In the acute setting, this is most commonly seen with atypical infection including viral pneumonia. If chronic, chronic atypical mycobacterial or fungal infection or chronic hypersensitivity pneumonitis could result in this appearance. Right basilar subsegmental consolidation. No central obstructing lesion. The lungs are symmetrically hyperinflated in keeping with changes of underlying COPD. No pulmonary edema. No evidence of pleural effusion or pneumothorax. UPPER ABDOMEN: Limited images of the upper abdomen are unremarkable. SOFT TISSUES AND BONES: The osseous structures are diffusely osteopenic with severe anterior wedge compression deformities of T6 - T9 with resultant severe kyphotic deformity. No retropulsion. No definite acute bone abnormality identified. No acute soft tissue  abnormality. IMPRESSION: 1. No central pulmonary embolism. Limited evaluation of segmental and subsegmental pulmonary emboli. 2. Diffuse peribronchovascular nodularity with mid and lower lung zone predominance and associated bronchial wall thickening, consistent with airway inflammation. Differential includes atypical infection in the acute setting or chronic infection or hypersensitivity pneumonitis if chronic. 3. Right basilar subsegmental consolidation. 4. Symmetrically hyperinflated lungs, consistent with underlying  COPD. 5. Diffuse osteopenia with severe anterior wedge compression deformities from T6-T9 and resultant severe kyphotic deformity, without retropulsion. 6. Extensive coronary artery calcifications. 7. Mild atherosclerotic calcification of the thoracic aorta. Electronically signed by: Dorethia Molt MD 07/14/2024 12:44 AM EST RP Workstation: [id number]   DG Chest Port 1 View Result Date: 07/13/2024 EXAM: 1 VIEW(S) XRAY OF THE CHEST 07/13/2024 06:00:00 PM COMPARISON: None available. CLINICAL HISTORY: shob, hypoxia FINDINGS: LUNGS AND PLEURA: Diffuse reticulonodular interstitial infiltrate, more purulent within the upper lung zones bilaterally, is new since prior examination and can be seen in the setting of atypical infection in the appropriate clinical setting. Trace bilateral pleural effusions are present. No pneumothorax. HEART AND MEDIASTINUM: No acute abnormality of the cardiac and mediastinal silhouettes. BONES AND SOFT TISSUES: Diffuse osteopenia. IMPRESSION: 1. Diffuse reticulonodular interstitial infiltrate with upper lung zone predominance bilaterally, possibly related to atypical infection. 2. Trace bilateral pleural effusions. 3. Diffuse osteopenia. Electronically signed by: Dorethia Molt MD 07/13/2024 08:25 PM EST RP Workstation: [id number]    Scheduled Meds:  Chlorhexidine  Gluconate Cloth  6 each Topical QHS   feeding supplement  237 mL Oral BID BM   furosemide   40 mg Intravenous  BID   heparin  5,000 Units Subcutaneous Q8H   ipratropium-albuterol   3 mL Nebulization BID   [START ON 07/16/2024] multivitamin with minerals  1 tablet Oral Daily   mouth rinse  15 mL Mouth Rinse 4 times per day   sodium chloride  flush  3 mL Intravenous Q12H   Vitamin D (Ergocalciferol)  50,000 Units Oral Q7 days   Continuous Infusions:  azithromycin  Stopped (07/15/24 0353)   cefTRIAXone  (ROCEPHIN )  IV Stopped (07/15/24 0204)     LOS: 2 days   Devaughn KATHEE Ban, MD [facility]  To contact the attending physician between 7A-7P please use Epic Chat. To contact the covering physician during after hours 7P-7A, please review Amion.  07/15/2024, 12:58 PM   *This document has been created with the assistance of dictation software. Please excuse typographical errors. *   "

## 2024-07-15 NOTE — Progress Notes (Signed)
 PT Cancellation Note  Patient Details Name: Prudy Candy MRN: [id number] DOB: 13-Jun-1951   Cancelled Treatment:    Reason Eval/Treat Not Completed: Other (comment);Medical issues which prohibited therapy (Just taken off Bi-pap, respiration rate remains elevated. Nurse asking PT to return next date. PT will continue with attempts.)  Randine Essex, PT, MPT  Randine LULLA Essex 07/15/2024, 2:03 PM

## 2024-07-15 NOTE — Progress Notes (Incomplete Revision)
"  ° °      Overnight   NAME: Traci Ballard MRN: [id number] DOB : Sep 15, 1950    Date of Service   07/15/2024   HPI/Events of Note   HPI:     Overnight:    Physical Exam    Interventions/ Plan   X X X    Updates     Traci Ballard BSN RN CCRN AGACNP-BC Acute Care Nurse Practitioner [facility] [place]  "

## 2024-07-15 NOTE — Progress Notes (Signed)
"  ° °      Overnight   NAME: Keaisha Sublette MRN: [id number] DOB : Sep 15, 1950    Date of Service   07/15/2024   HPI/Events of Note   HPI:     Overnight:    Physical Exam    Interventions/ Plan   X X X    Updates     [name] BSN RN CCRN AGACNP-BC Acute Care Nurse Practitioner [facility] [place]  "

## 2024-07-15 NOTE — Progress Notes (Signed)
 OT Cancellation Note  Patient Details Name: Traci Ballard MRN: [id number] DOB: [birth year]   Cancelled Treatment:    Reason Eval/Treat Not Completed: Fatigue/lethargy limiting ability to participate. Chart reviewed, AM attempt pt on bipap, PM attempt pt with increased RR at rest, RN requesting to hold. Will reattempt next date as appropriate to initiate services.   Elston Slot, M.S. OTR/L  07/15/2024, 2:03 PM  ascom [phone]

## 2024-07-15 NOTE — Progress Notes (Signed)
 Initial Nutrition Assessment  DOCUMENTATION CODES:   Underweight  INTERVENTION:   Ensure Plus High Protein po BID, each supplement provides 350 kcal and 20 grams of protein  MVI po daily   Liberalize diet   Pt at high refeed risk; recommend monitor potassium, magnesium and phosphorus labs daily until stable  Daily weights   NUTRITION DIAGNOSIS:   Inadequate oral intake related to acute illness as evidenced by meal completion < 25%.  GOAL:   Patient will meet greater than or equal to 90% of their needs  MONITOR:   PO intake, Supplement acceptance, Labs, Weight trends, Skin, I & O's  REASON FOR ASSESSMENT:   Malnutrition Screening Tool    ASSESSMENT:   73 y/o female with no known medical history who is admitted with suspected CHF and COPD exacerbation.  RD working remotely.  RD suspects pt with decreased appetite and oral intake pta r/t SOB. Pt with poor appetite and oral intake in hospital and is currently on bipap. Pt is ordered for Ensure supplements. RD will monitor pt's oral intake and the need for possible nutrition support. Pt is at high refeed risk. There is no documented weight history in chart to determine if any significant recent changes. Pt likely with severe malnutrition. RD will obtain exam and history at follow up.    Medications reviewed and include: lasix , heparin, ergocalciferol, azithromycin , ceftriaxone    Labs reviewed: Na 147(H), K 3.9 wnl, BUN 46(H), Mg 2.7(H) Vitamin D- 10.3(L)- 10/29 Wbc- 12.4(L) Cbgs- 116, 116, 131 x 24 hrs  AIC 5.6- 12/29  NUTRITION - FOCUSED PHYSICAL EXAM: Unable to perform at this time   Diet Order:   Diet Order             Diet 2 gram sodium Room service appropriate? Yes; Fluid consistency: Thin; Fluid restriction: 1500 mL Fluid  Diet effective now                  EDUCATION NEEDS:   No education needs have been identified at this time  Skin:  Skin Assessment: Reviewed RN Assessment  Last BM:   pta  Height:   Ht Readings from Last 1 Encounters:  07/13/24 5' 5 (1.651 m)    Weight:   Wt Readings from Last 1 Encounters:  07/15/24 47.6 kg    Ideal Body Weight:  56.8 kg  BMI:  Body mass index is 17.46 kg/m.  Estimated Nutritional Needs:   Kcal:  1400-1600kcal/day  Protein:  70-80g/day  Fluid:  1.2-1.4L/day  Augustin Shams MS, RD, LDN If unable to be reached, please send secure chat to RD inpatient available from 8:00a-4:00p daily

## 2024-07-15 NOTE — Care Management Important Message (Signed)
 Important Message  Patient Details  Name: Traci Ballard MRN: [id number] Date of Birth: 1950/09/30   Important Message Given:  Yes - Medicare IM     [name] 07/15/2024, 2:39 PM

## 2024-07-16 DIAGNOSIS — J9602 Acute respiratory failure with hypercapnia: Secondary | ICD-10-CM | POA: Diagnosis not present

## 2024-07-16 DIAGNOSIS — J9601 Acute respiratory failure with hypoxia: Secondary | ICD-10-CM | POA: Diagnosis not present

## 2024-07-16 LAB — GLUCOSE, CAPILLARY
Glucose-Capillary: 102 mg/dL — ABNORMAL HIGH (ref 70–99)
Glucose-Capillary: 135 mg/dL — ABNORMAL HIGH (ref 70–99)
Glucose-Capillary: 221 mg/dL — ABNORMAL HIGH (ref 70–99)
Glucose-Capillary: 223 mg/dL — ABNORMAL HIGH (ref 70–99)
Glucose-Capillary: 145 mg/dL — ABNORMAL HIGH (ref 70–99)

## 2024-07-16 LAB — BASIC METABOLIC PANEL WITH GFR
Anion gap: 6 (ref 5–15)
BUN: 32 mg/dL — ABNORMAL HIGH (ref 8–23)
CO2: 44 mmol/L — ABNORMAL HIGH (ref 22–32)
Calcium: 9.1 mg/dL (ref 8.9–10.3)
Chloride: 100 mmol/L (ref 98–111)
Creatinine, Ser: 0.41 mg/dL — ABNORMAL LOW (ref 0.44–1.00)
GFR, Estimated: >60 mL/min
Glucose, Bld: 151 mg/dL — ABNORMAL HIGH (ref 70–99)
Potassium: 3.8 mmol/L (ref 3.5–5.1)
Sodium: 150 mmol/L — ABNORMAL HIGH (ref 135–145)

## 2024-07-16 LAB — BLOOD GAS, ARTERIAL
Acid-Base Excess: 25.6 mmol/L — ABNORMAL HIGH (ref 0.0–2.0)
Bicarbonate: 52.5 mmol/L — ABNORMAL HIGH (ref 20.0–28.0)
O2 Content: 2 L/min
O2 Saturation: 95.6 %
Patient temperature: 37
pCO2 arterial: 60 mmHg — ABNORMAL HIGH (ref 32–48)
pH, Arterial: 7.55 — ABNORMAL HIGH (ref 7.35–7.45)
pO2, Arterial: 63 mmHg — ABNORMAL LOW (ref 83–108)

## 2024-07-16 MED ORDER — SODIUM CHLORIDE 0.45 % IV SOLN: INTRAVENOUS | Status: DC

## 2024-07-16 MED ORDER — IPRATROPIUM-ALBUTEROL 0.5-2.5 (3) MG/3ML IN SOLN
3.0000 mL | Freq: Three times a day (TID) | RESPIRATORY_TRACT | Status: DC
  Administered 2024-07-17 – 2024-07-18 (×4): 3 mL via RESPIRATORY_TRACT
  Filled 2024-07-16 (×4): qty 3

## 2024-07-16 MED ORDER — DEXTROSE 5 % IV SOLN: INTRAVENOUS | Status: DC

## 2024-07-16 NOTE — Progress Notes (Signed)
 RN spoke with Traci Ballard Pharm D with pharmacy about infiltration of the right IV site with redness and pain at site. RN informed Traci Ballard that last drug infused was Zithromax  and NS was at KVO when patient reported pain during shift change. Traci Ballard reports there is no treatment for the infiltration of Zithromax  and to just elevate the arm for comfort and RN may apply a warm compress if needed. Rn notified nurse Traci Ballard that is caring for patient on dayshift.

## 2024-07-16 NOTE — Progress Notes (Signed)
 " PROGRESS NOTE    Traci Ballard  FMW:[id number] DOB: 1951-05-26 DOA: 07/13/2024 PCP: Tanda Katz, MD  Chief Complaint  Patient presents with   Shortness of Breath    Hospital Course:  Traci Ballard is a 74 y.o. year old female without known medical history presenting to the ED with worsening shortness of breath.  She states has been having shortness of breath for over a week.  She has been having some coughing with sputum production.  Denies any fever or chills but states her body is having difficulty with thermoregulation.  She states she does not have any medical problems and does not take any medications other than over-the-counter vitamin D  Patient admitted for acute hypoxic and hypercapnic respiratory failure, on BiPAP.  Admitted with suspected COPD exacerbation and acute heart failure.  Hospital course as below  Subjective: Breathing comfortably off bipap today.    Objective: Vitals:   07/16/24 0445 07/16/24 0600 07/16/24 0813 07/16/24 0850  BP:    (!) 142/63  Pulse:    86  Resp:    15  Temp:    98.2 F (36.8 C)  TempSrc:    Oral  SpO2:  96% 98% 100%  Weight: 46.9 kg     Height:        Intake/Output Summary (Last 24 hours) at 07/16/2024 1409 Last data filed at 07/16/2024 0530 Gross per 24 hour  Intake 470 ml  Output 800 ml  Net -330 ml   Filed Weights   07/14/24 0454 07/15/24 0500 07/16/24 0445  Weight: 47 kg 47.6 kg 46.9 kg    Examination: Gen: nad CV: sinus rhythm and tachycardic rate,  Lung: diminished breath sounds, rhonchorous Abd: No TTP, normal bowel sounds MSK: No asymmetry, cachetic appearance  Neuro: alert and oriented  Assessment & Plan:  Acute hypoxic and hypercapnic respiratory failure  2/2 copd exacerbation, possible pna. Required bipap overnight, stable today, satting well on 2 liters Will wean o2 as able discussed home bipap with pulm. Will check abg, per dr. DELENA, if co2 greater than 55 should qualify for home  bipap  CHF? Tte with mod mr/tr, preserved ef, think copd primary driver of sympotms - d/c lasix   Hypernatremia No aki.  - push hydration - gentle half normal fluids today  COPD exacerbation No formal diagnosis but symptoms, ct scan, and smoking history suggestive RVP negative CT PE negative for PE.  Diffuse peribronchovascular nodularity and bronchial wall thickening, consistent with airway inflammation On empiric coverage for multifocal pneumonia Viral panels neg Agreeable to steroids, started 12/31 Check sputum for culture, urine antigens Continue ceftriaxone /azithromycin   Elevated troponin 26 -> 31, suspect due to demand ischemia Trend troponin  Severe vitamin D deficiency 50,000 units weekly  Diffuse osteopenia with severe anterior wedge compression deformities from T6-T9 and resultant severe kyphotic deformity Denies acute back pain  Debility PT advising snf. Toc consulted   DVT prophylaxis: Lovenox   Code Status: Full Code Disposition:  snf Family: daughter updated @ bedside 1/1  Consultants:  None  Procedures:  None  Antimicrobials:  Anti-infectives (From admission, onward)    Start     Dose/Rate Route Frequency Ordered Stop   07/16/24 0100  cefTRIAXone  (ROCEPHIN ) 1 g in sodium chloride  0.9 % 100 mL IVPB        1 g 200 mL/hr over 30 Minutes Intravenous Every 24 hours 07/15/24 1302     07/14/24 0130  azithromycin  (ZITHROMAX ) 500 mg in sodium chloride  0.9 % 250 mL  IVPB        500 mg 250 mL/hr over 60 Minutes Intravenous Every 24 hours 07/14/24 0041 07/16/24 0643   07/14/24 0045  cefTRIAXone  (ROCEPHIN ) 2 g in sodium chloride  0.9 % 100 mL IVPB  Status:  Discontinued        2 g 200 mL/hr over 30 Minutes Intravenous Every 24 hours 07/13/24 2348 07/15/24 1302       Data Reviewed: I have personally reviewed following labs and imaging studies CBC: Recent Labs  Lab 07/13/24 1747 07/14/24 0319 07/15/24 0247  WBC 12.4* 10.4 12.4*  NEUTROABS 9.2*  --    --   HGB 15.7* 13.6 13.4  HCT 47.8* 41.1 42.2  MCV 98.8 98.8 102.2*  PLT 383 305 312   Basic Metabolic Panel: Recent Labs  Lab 07/13/24 1747 07/13/24 2307 07/14/24 0319 07/15/24 0247 07/16/24 0408  NA 137  --  140 147* 150*  K 3.5  --  4.0 3.9 3.8  CL 93*  --  95* 98 100  CO2 32  --  34* 41* 44*  GLUCOSE 146*  --  137* 118* 151*  BUN 38*  --  39* 46* 32*  CREATININE 0.59  --  0.53 0.60 0.41*  CALCIUM 10.1  --  8.8* 9.2 9.1  MG  --  2.4  --  2.7*  --    GFR: Estimated Creatinine Clearance: 45 mL/min (A) (by C-G formula based on SCr of 0.41 mg/dL (L)). Liver Function Tests: Recent Labs  Lab 07/13/24 1747  AST 19  ALT 21  ALKPHOS 128*  BILITOT 0.8  PROT 7.8  ALBUMIN 4.3   CBG: Recent Labs  Lab 07/15/24 1127 07/15/24 2111 07/16/24 0104 07/16/24 0852 07/16/24 1147  GLUCAP 106* 229* 102* 135* 221*    Recent Results (from the past 240 hours)  Resp panel by RT-PCR (RSV, Flu A&B, Covid) Anterior Nasal Swab     Status: None   Collection Time: 07/13/24  5:45 PM   Specimen: Anterior Nasal Swab  Result Value Ref Range Status   SARS Coronavirus 2 by RT PCR NEGATIVE NEGATIVE Final    Comment: (NOTE) SARS-CoV-2 target nucleic acids are NOT DETECTED.  The SARS-CoV-2 RNA is generally detectable in upper respiratory specimens during the acute phase of infection. The lowest concentration of SARS-CoV-2 viral copies this assay can detect is 138 copies/mL. A negative result does not preclude SARS-Cov-2 infection and should not be used as the sole basis for treatment or other patient management decisions. A negative result may occur with  improper specimen collection/handling, submission of specimen other than nasopharyngeal swab, presence of viral mutation(s) within the areas targeted by this assay, and inadequate number of viral copies(<138 copies/mL). A negative result must be combined with clinical observations, patient history, and epidemiological information. The  expected result is Negative.  Fact Sheet for Patients:  bloggercourse.com  Fact Sheet for Healthcare Providers:  seriousbroker.it  This test is no t yet approved or cleared by the [place]  FDA and  has been authorized for detection and/or diagnosis of SARS-CoV-2 by FDA under an Emergency Use Authorization (EUA). This EUA will remain  in effect (meaning this test can be used) for the duration of the COVID-19 declaration under Section 564(b)(1) of the Act, 21 U.S.C.section 360bbb-3(b)(1), unless the authorization is terminated  or revoked sooner.       Influenza A by PCR NEGATIVE NEGATIVE Final   Influenza B by PCR NEGATIVE NEGATIVE Final    Comment: (NOTE) The Xpert  Xpress SARS-CoV-2/FLU/RSV plus assay is intended as an aid in the diagnosis of influenza from Nasopharyngeal swab specimens and should not be used as a sole basis for treatment. Nasal washings and aspirates are unacceptable for Xpert Xpress SARS-CoV-2/FLU/RSV testing.  Fact Sheet for Patients: bloggercourse.com  Fact Sheet for Healthcare Providers: seriousbroker.it  This test is not yet approved or cleared by the [place]  FDA and has been authorized for detection and/or diagnosis of SARS-CoV-2 by FDA under an Emergency Use Authorization (EUA). This EUA will remain in effect (meaning this test can be used) for the duration of the COVID-19 declaration under Section 564(b)(1) of the Act, 21 U.S.C. section 360bbb-3(b)(1), unless the authorization is terminated or revoked.     Resp Syncytial Virus by PCR NEGATIVE NEGATIVE Final    Comment: (NOTE) Fact Sheet for Patients: bloggercourse.com  Fact Sheet for Healthcare Providers: seriousbroker.it  This test is not yet approved or cleared by the [place]  FDA and has been authorized for detection and/or  diagnosis of SARS-CoV-2 by FDA under an Emergency Use Authorization (EUA). This EUA will remain in effect (meaning this test can be used) for the duration of the COVID-19 declaration under Section 564(b)(1) of the Act, 21 U.S.C. section 360bbb-3(b)(1), unless the authorization is terminated or revoked.  Performed at [facility], [address] [place], [place] [address]   MRSA Next Gen by PCR, Nasal     Status: None   Collection Time: 07/13/24 10:53 PM   Specimen: Nasal Mucosa; Nasal Swab  Result Value Ref Range Status   MRSA by PCR Next Gen NOT DETECTED NOT DETECTED Final    Comment: (NOTE) The GeneXpert MRSA Assay (FDA approved for NASAL specimens only), is one component of a comprehensive MRSA colonization surveillance program. It is not intended to diagnose MRSA infection nor to guide or monitor treatment for MRSA infections. Test performance is not FDA approved in patients less than 47 years old. Performed at [facility], [address] [place], [place] [address]   Respiratory (~20 pathogens) panel by PCR     Status: None   Collection Time: 07/14/24 12:09 PM   Specimen: Nasopharyngeal Swab; Respiratory  Result Value Ref Range Status   Adenovirus NOT DETECTED NOT DETECTED Final   Coronavirus 229E NOT DETECTED NOT DETECTED Final    Comment: (NOTE) The Coronavirus on the Respiratory Panel, DOES NOT test for the novel  Coronavirus (2019 nCoV)    Coronavirus HKU1 NOT DETECTED NOT DETECTED Final   Coronavirus NL63 NOT DETECTED NOT DETECTED Final   Coronavirus OC43 NOT DETECTED NOT DETECTED Final   Metapneumovirus NOT DETECTED NOT DETECTED Final   Rhinovirus / Enterovirus NOT DETECTED NOT DETECTED Final   Influenza A NOT DETECTED NOT DETECTED Final   Influenza B NOT DETECTED NOT DETECTED Final   Parainfluenza Virus 1 NOT DETECTED NOT DETECTED Final   Parainfluenza Virus 2 NOT DETECTED NOT DETECTED Final   Parainfluenza Virus 3 NOT DETECTED NOT DETECTED  Final   Parainfluenza Virus 4 NOT DETECTED NOT DETECTED Final   Respiratory Syncytial Virus NOT DETECTED NOT DETECTED Final   Bordetella pertussis NOT DETECTED NOT DETECTED Final   Bordetella Parapertussis NOT DETECTED NOT DETECTED Final   Chlamydophila pneumoniae NOT DETECTED NOT DETECTED Final   Mycoplasma pneumoniae NOT DETECTED NOT DETECTED Final    Comment: Performed at [facility], [address], [place], [place] [address]  Expectorated Sputum Assessment w Gram Stain, Rflx to Resp Cult     Status:  None   Collection Time: 07/15/24  4:00 PM   Specimen: Expectorated Sputum  Result Value Ref Range Status   Specimen Description EXPECTORATED SPUTUM  Final   Special Requests NONE  Final   Sputum evaluation   Final    THIS SPECIMEN IS ACCEPTABLE FOR SPUTUM CULTURE Performed at [facility], [address], [place], [place] [address]    Report Status 07/15/2024 FINAL  Final  Culture, Respiratory w Gram Stain     Status: None (Preliminary result)   Collection Time: 07/15/24  4:00 PM  Result Value Ref Range Status   Specimen Description   Final    EXPECTORATED SPUTUM Performed at [facility], [address], [place], [place] [address]    Special Requests   Final    NONE Reflexed from [id number] Performed at [facility], [address] [place], [place] [address]    Gram Stain RARE WBC SEEN NO ORGANISMS SEEN   Final   Culture   Final    NO GROWTH < 12 HOURS Performed at [facility], [address] [place], [place] [address]    Report Status PENDING  Incomplete     Radiology Studies: No results found.   Scheduled Meds:  enoxaparin (LOVENOX) injection  40 mg Subcutaneous Q24H   feeding supplement  237 mL Oral BID BM   ipratropium-albuterol   3 mL Nebulization Q6H   multivitamin with minerals  1 tablet Oral Daily   mouth rinse  15 mL Mouth Rinse 4 times per day   predniSONE  40 mg Oral Q breakfast   sodium chloride  flush  3 mL  Intravenous Q12H   Vitamin D (Ergocalciferol)  50,000 Units Oral Q7 days   Continuous Infusions:  sodium chloride      cefTRIAXone  (ROCEPHIN )  IV Stopped (07/16/24 0234)     LOS: 3 days   Devaughn KATHEE Ban, MD [facility]  To contact the attending physician between 7A-7P please use Epic Chat. To contact the covering physician during after hours 7P-7A, please review Amion.  07/16/2024, 2:09 PM     "

## 2024-07-16 NOTE — Plan of Care (Signed)
" °  Problem: Clinical Measurements: Goal: Will remain free from infection Outcome: Progressing Goal: Diagnostic test results will improve Outcome: Progressing Goal: Cardiovascular complication will be avoided Outcome: Progressing   Problem: Nutrition: Goal: Adequate nutrition will be maintained Outcome: Progressing   Problem: Elimination: Goal: Will not experience complications related to bowel motility Outcome: Progressing Goal: Will not experience complications related to urinary retention Outcome: Progressing   Problem: Pain Managment: Goal: General experience of comfort will improve and/or be controlled Outcome: Progressing   Problem: Safety: Goal: Ability to remain free from injury will improve Outcome: Progressing   Problem: Skin Integrity: Goal: Risk for impaired skin integrity will decrease Outcome: Progressing   "

## 2024-07-16 NOTE — Plan of Care (Signed)

## 2024-07-16 NOTE — Plan of Care (Signed)

## 2024-07-16 NOTE — Evaluation (Signed)
 Occupational Therapy Evaluation Patient Details Name: Traci Ballard MRN: [id number] DOB: 29-Sep-1950 Today's Date: 07/16/2024   History of Present Illness   Patient is a 74 year old female with acute respiratory failure with hypoxia and hypercapnia. No known medical history.     Clinical Impressions Patient presenting with decreased Ind in self care, balance, functional mobility, transfers, endurance, and safety awareness. Patient reports being Ind at baseline and living alone. Pt currently on 5Ls O2 via [place] this session and is able to remain at or above 90% during session. Patient having been incontinent of BM in bed and needs min A for stand pivot transfer to [facility]. Pt able to void further. Mod A for toileting for hygiene and assist with clothing management. Pt returning back to bed secondary to fatigue. Patient will benefit from acute OT to increase overall independence in the areas of ADLs, functional mobility, and safety awareness in order to safely discharge.     If plan is discharge home, recommend the following:   A lot of help with bathing/dressing/bathroom;Assistance with cooking/housework;A little help with walking and/or transfers;Assist for transportation;Help with stairs or ramp for entrance;Direct supervision/assist for medications management;Direct supervision/assist for financial management     Functional Status Assessment   Patient has had a recent decline in their functional status and demonstrates the ability to make significant improvements in function in a reasonable and predictable amount of time.     Equipment Recommendations   BSC/3in1      Precautions/Restrictions   Precautions Precautions: Fall     Mobility Bed Mobility Overal bed mobility: Needs Assistance Bed Mobility: Sit to Supine, Supine to Sit     Supine to sit: Contact guard Sit to supine: Contact guard assist        Transfers Overall transfer level: Needs  assistance Equipment used: 1 person hand held assist Transfers: Bed to chair/wheelchair/BSC, Sit to/from Stand Sit to Stand: Min assist     Step pivot transfers: Min assist            Balance Overall balance assessment: Needs assistance Sitting-balance support: Feet supported, Bilateral upper extremity supported Sitting balance-Leahy Scale: Fair                                     ADL either performed or assessed with clinical judgement   ADL Overall ADL's : Needs assistance/impaired                                       General ADL Comments: min A stand pivot transfer to BSC from EOB. Pt having BM and needing assistance for clothing management and hygiene     Vision Patient Visual Report: No change from baseline              Pertinent Vitals/Pain Pain Assessment Pain Assessment: No/denies pain     Extremity/Trunk Assessment Upper Extremity Assessment Upper Extremity Assessment: Generalized weakness   Lower Extremity Assessment Lower Extremity Assessment: Generalized weakness   Cervical / Trunk Assessment Cervical / Trunk Assessment: Kyphotic   Communication Communication Communication: No apparent difficulties   Cognition Arousal: Alert Behavior During Therapy: Anxious Cognition: Cognition impaired   Orientation impairments: Situation, Time Awareness: Intellectual awareness impaired, Online awareness impaired   Attention impairment (select first level of impairment): Selective attention Executive functioning impairment (select all impairments):  Reasoning                   Following commands: Impaired Following commands impaired: Follows one step commands with increased time     Cueing  General Comments   Cueing Techniques: Verbal cues;Tactile cues              Home Living Family/patient expects to be discharged to:: Private residence Living Arrangements: Alone Available Help at Discharge:  Neighbor Type of Home: House Home Access: Stairs to enter [employer] of Steps: unsure   Home Layout: Two level;Able to live on main level with bedroom/bathroom                   Additional Comments: patient reports she sleeps in a recliner. Home information obtained from PT evaluation. Pt does not want to share any home set up with this therapist because she is worried about another family member taking items from her home.      Prior Functioning/Environment Prior Level of Function : Independent/Modified Independent;Driving             Mobility Comments: independent ADLs Comments: independent    OT Problem List: Decreased strength;Decreased safety awareness;Decreased activity tolerance;Impaired balance (sitting and/or standing)   OT Treatment/Interventions: Self-care/ADL training;Therapeutic activities;Therapeutic exercise;Energy conservation;Patient/family education;Balance training      OT Goals(Current goals can be found in the care plan section)   Acute Rehab OT Goals Patient Stated Goal: to go to daughter's home OT Goal Formulation: With patient Time For Goal Achievement: 07/30/24 Potential to Achieve Goals: Fair ADL Goals Pt Will Perform Grooming: with supervision;sitting Pt Will Perform Lower Body Dressing: with supervision;sit to/from stand Pt Will Transfer to Toilet: with supervision;ambulating Pt Will Perform Toileting - Clothing Manipulation and hygiene: with supervision;sit to/from stand   OT Frequency:  Min 2X/week       AM-PAC OT 6 Clicks Daily Activity     Outcome Measure Help from another person eating meals?: None Help from another person taking care of personal grooming?: A Little Help from another person toileting, which includes using toliet, bedpan, or urinal?: A Lot Help from another person bathing (including washing, rinsing, drying)?: A Lot Help from another person to put on and taking off regular upper body clothing?: A  Little Help from another person to put on and taking off regular lower body clothing?: A Lot 6 Click Score: 16   End of Session Equipment Utilized During Treatment: Rolling walker (2 wheels)  Activity Tolerance: Patient limited by fatigue;Patient tolerated treatment well Patient left: in bed;with call bell/phone within reach;with bed alarm set  OT Visit Diagnosis: Unsteadiness on feet (R26.81);Repeated falls (R29.6);Muscle weakness (generalized) (M62.81)                Time: 8962-8942 OT Time Calculation (min): 20 min Charges:  OT General Charges $OT Visit: 1 Visit OT Evaluation $OT Eval Moderate Complexity: 1 Mod OT Treatments $Self Care/Home Management : 8-22 mins  Izetta Claude, MS, OTR/L , CBIS ascom [phone]  07/16/2024, 1:35 PM

## 2024-07-17 DIAGNOSIS — J9601 Acute respiratory failure with hypoxia: Secondary | ICD-10-CM | POA: Diagnosis not present

## 2024-07-17 DIAGNOSIS — J9602 Acute respiratory failure with hypercapnia: Secondary | ICD-10-CM | POA: Diagnosis not present

## 2024-07-17 LAB — GLUCOSE, CAPILLARY
Glucose-Capillary: 89 mg/dL (ref 70–99)
Glucose-Capillary: 139 mg/dL — ABNORMAL HIGH (ref 70–99)
Glucose-Capillary: 178 mg/dL — ABNORMAL HIGH (ref 70–99)
Glucose-Capillary: 112 mg/dL — ABNORMAL HIGH (ref 70–99)

## 2024-07-17 LAB — BASIC METABOLIC PANEL WITH GFR
Anion gap: 6 (ref 5–15)
BUN: 27 mg/dL — ABNORMAL HIGH (ref 8–23)
CO2: 41 mmol/L — ABNORMAL HIGH (ref 22–32)
Calcium: 8.4 mg/dL — ABNORMAL LOW (ref 8.9–10.3)
Chloride: 96 mmol/L — ABNORMAL LOW (ref 98–111)
Creatinine, Ser: 0.38 mg/dL — ABNORMAL LOW (ref 0.44–1.00)
GFR, Estimated: >60 mL/min
Glucose, Bld: 113 mg/dL — ABNORMAL HIGH (ref 70–99)
Potassium: 3.9 mmol/L (ref 3.5–5.1)
Sodium: 143 mmol/L (ref 135–145)

## 2024-07-17 LAB — LEGIONELLA PNEUMOPHILA SEROGP 1 UR AG: L. pneumophila Serogp 1 Ur Ag: NEGATIVE

## 2024-07-17 MED ORDER — LOPERAMIDE HCL 2 MG PO CAPS
2.0000 mg | ORAL_CAPSULE | Freq: Four times a day (QID) | ORAL | Status: DC | PRN
  Administered 2024-07-19 (×2): 2 mg via ORAL
  Filled 2024-07-17 (×2): qty 1

## 2024-07-17 MED ORDER — INFLUENZA VAC SPLIT HIGH-DOSE 0.5 ML IM SUSY
0.5000 mL | PREFILLED_SYRINGE | INTRAMUSCULAR | Status: DC
  Filled 2024-07-17: qty 0.5

## 2024-07-17 MED ORDER — PREDNISONE 20 MG PO TABS: 30.0000 mg | ORAL_TABLET | Freq: Every day | ORAL | Status: DC

## 2024-07-17 MED ORDER — PREDNISONE 20 MG PO TABS
40.0000 mg | ORAL_TABLET | Freq: Every day | ORAL | Status: DC
  Administered 2024-07-18: 40 mg via ORAL
  Filled 2024-07-17: qty 2

## 2024-07-17 MED ORDER — SODIUM CHLORIDE 0.9 % IV SOLN
1.0000 g | INTRAVENOUS | Status: AC
  Administered 2024-07-18: 1 g via INTRAVENOUS
  Filled 2024-07-17: qty 10

## 2024-07-17 NOTE — Progress Notes (Signed)
 Pt's daughter at bedside is asking about POC and discharge. Provided education and notified TOC re: request for planning. Also provided education on mobility and improved outcomes with increased mobility. Both verbalizes understanding.

## 2024-07-17 NOTE — Progress Notes (Signed)
 Physical Therapy Treatment Patient Details Name: Traci Ballard MRN: [id number] DOB: 1950-11-26 Today's Date: 07/17/2024   History of Present Illness Patient is a 74 year old female with acute respiratory failure with hypoxia and hypercapnia. No known medical history.    PT Comments  Pt is progressing with basic mobility performing at an overall CGA level.  Pt continues to have difficulty keeping SPO2 >90%: Seated: SPO2 on 2 L 78% HR 105bpm, Seated/Standing:SPO2 on 4 L 84% HR 95bpm, Standing/ambulation/seated: SPO2 on 5 L 90-93% HR 88bpm.  Pt continues to experience limitations to mobility, demonstrating decreased activity tolerance and general weakness.   Continue to recommend post acute rehab <3 hours therapy/day upon d/c.     If plan is discharge home, recommend the following: A little help with walking and/or transfers;A little help with bathing/dressing/bathroom;Help with stairs or ramp for entrance;Assist for transportation;Assistance with cooking/housework   Can travel by private vehicle     Yes  Equipment Recommendations  Rolling walker (2 wheels)    Recommendations for Other Services       Precautions / Restrictions Precautions Precautions: Fall Restrictions Weight Bearing Restrictions Per Provider Order: No     Mobility  Bed Mobility Overal bed mobility: Needs Assistance Bed Mobility: Supine to Sit     Supine to sit: Supervision, HOB elevated, Used rails     General bed mobility comments: cues for technique.    Transfers Overall transfer level: Needs assistance Equipment used: Rolling walker (2 wheels) Transfers: Sit to/from Stand, Bed to chair/wheelchair/BSC Sit to Stand: Contact guard assist   Step pivot transfers: Contact guard assist       General transfer comment: cues for safe hand placement    Ambulation/Gait Ambulation/Gait assistance: Contact guard assist Gait Distance (Feet): 15 Feet Assistive device: Rolling walker (2 wheels) Gait  Pattern/deviations: Decreased stride length, Step-through pattern, Shuffle, Trunk flexed       General Gait Details: cues to keep close to walker   Stairs             Wheelchair Mobility     Tilt Bed    Modified Rankin (Stroke Patients Only)       Balance Overall balance assessment: Needs assistance Sitting-balance support: Feet supported, Bilateral upper extremity supported, Single extremity supported Sitting balance-Leahy Scale: Good Sitting balance - Comments: Intermittent UE support. Seated: SPO2 on 2 L  78% HR 105bpm, Seated/Standing:SPO2 on 4 L 84% HR 95bpm, Standing/ambulation/seated: SPO2 on 5 L 90-93% HR 88bpm   Standing balance support: Bilateral upper extremity supported, During functional activity, Reliant on assistive device for balance Standing balance-Leahy Scale: Fair Standing balance comment: static standing with RW for hygiene activity, supervision to CGA, LOB.                            Communication Communication Communication: No apparent difficulties  Cognition Arousal: Alert Behavior During Therapy: WFL for tasks assessed/performed   PT - Cognitive impairments: No apparent impairments                         Following commands: Impaired Following commands impaired: Follows one step commands with increased time    Cueing Cueing Techniques: Verbal cues, Tactile cues  Exercises      General Comments        Pertinent Vitals/Pain Pain Assessment Pain Assessment: No/denies pain    Home Living  Prior Function            PT Goals (current goals can now be found in the care plan section) Acute Rehab PT Goals Patient Stated Goal: to feel better PT Goal Formulation: With patient Time For Goal Achievement: 07/28/24 Potential to Achieve Goals: Fair Progress towards PT goals: Progressing toward goals    Frequency    Min 2X/week      PT Plan      Co-evaluation               AM-PAC PT 6 Clicks Mobility   Outcome Measure  Help needed turning from your back to your side while in a flat bed without using bedrails?: None Help needed moving from lying on your back to sitting on the side of a flat bed without using bedrails?: A Little Help needed moving to and from a bed to a chair (including a wheelchair)?: A Little Help needed standing up from a chair using your arms (e.g., wheelchair or bedside chair)?: A Little Help needed to walk in hospital room?: A Little Help needed climbing 3-5 steps with a railing? : A Lot 6 Click Score: 18    End of Session Equipment Utilized During Treatment: Oxygen;Gait belt Activity Tolerance: Patient limited by fatigue Patient left: with call bell/phone within reach;in chair;with chair alarm set;with family/visitor present Nurse Communication: Mobility status PT Visit Diagnosis: Muscle weakness (generalized) (M62.81);Unsteadiness on feet (R26.81)     Time: 8982-8942 PT Time Calculation (min) (ACUTE ONLY): 40 min  Charges:    $Therapeutic Activity: 38-52 mins PT General Charges $$ ACUTE PT VISIT: 1 Visit                     Harland Irving, PTA  07/17/2024, 11:22 AM

## 2024-07-17 NOTE — Progress Notes (Signed)
 Occupational Therapy Treatment Patient Details Name: Traci Ballard MRN: [id number] DOB: Sep 15, 1950 Today's Date: 07/17/2024   History of present illness Patient is a 74 year old female with acute respiratory failure with hypoxia and hypercapnia. No known medical history.   OT comments  Traci Ballard was seen for OT treatment on this date. Upon arrival to room pt seated in chair, calling out to be checked, with cues pt expressed concern for BM in chair. Pt requires CGA sit<>stand x4 attempts, cues for hand placement. Noted to have loose stool continuing in standing with pt unable to state when completed. Requires MAX A pericare standing, good standing tolerance. Pt making good progress toward goals, will continue to follow POC. Discharge recommendation remains appropriate.       If plan is discharge home, recommend the following:  A lot of help with bathing/dressing/bathroom;Assistance with cooking/housework;A little help with walking and/or transfers;Assist for transportation;Help with stairs or ramp for entrance;Direct supervision/assist for medications management;Direct supervision/assist for financial management   Equipment Recommendations  BSC/3in1    Recommendations for Other Services      Precautions / Restrictions Precautions Precautions: Fall Recall of Precautions/Restrictions: Impaired Restrictions Weight Bearing Restrictions Per Provider Order: No       Mobility Bed Mobility               General bed mobility comments: not tested    Transfers Overall transfer level: Needs assistance Equipment used: Rolling walker (2 wheels) Transfers: Sit to/from Stand Sit to Stand: Contact guard assist                 Balance Overall balance assessment: Needs assistance Sitting-balance support: No upper extremity supported, Feet supported Sitting balance-Leahy Scale: Good     Standing balance support: Bilateral upper extremity supported, During functional  activity Standing balance-Leahy Scale: Fair                             ADL either performed or assessed with clinical judgement   ADL Overall ADL's : Needs assistance/impaired                                       General ADL Comments: MIN A for BSC t/f. MAX A pericare standing.     Communication Communication Communication: No apparent difficulties   Cognition Arousal: Alert Behavior During Therapy: WFL for tasks assessed/performed Cognition: Cognition impaired   Orientation impairments: Situation, Time         OT - Cognition Comments: tangential and questionable insight into incontinence                 Following commands: Impaired Following commands impaired: Follows one step commands with increased time      Cueing   Cueing Techniques: Verbal cues, Tactile cues             Pertinent Vitals/ Pain       Pain Assessment Pain Assessment: No/denies pain   Frequency  Min 2X/week        Progress Toward Goals  OT Goals(current goals can now be found in the care plan section)  Progress towards OT goals: Progressing toward goals  Acute Rehab OT Goals OT Goal Formulation: With patient Time For Goal Achievement: 07/30/24 Potential to Achieve Goals: Fair ADL Goals Pt Will Perform Grooming: with supervision;sitting Pt Will Perform Lower Body Dressing: with  supervision;sit to/from stand Pt Will Transfer to Toilet: with supervision;ambulating Pt Will Perform Toileting - Clothing Manipulation and hygiene: with supervision;sit to/from stand  Plan      Co-evaluation                 AM-PAC OT 6 Clicks Daily Activity     Outcome Measure   Help from another person eating meals?: None Help from another person taking care of personal grooming?: A Little Help from another person toileting, which includes using toliet, bedpan, or urinal?: A Lot Help from another person bathing (including washing, rinsing, drying)?: A  Lot Help from another person to put on and taking off regular upper body clothing?: A Little Help from another person to put on and taking off regular lower body clothing?: A Lot 6 Click Score: 16    End of Session Equipment Utilized During Treatment: Rolling walker (2 wheels);Oxygen  OT Visit Diagnosis: Unsteadiness on feet (R26.81);Repeated falls (R29.6);Muscle weakness (generalized) (M62.81)   Activity Tolerance Patient tolerated treatment well   Patient Left in chair;with call bell/phone within reach;with chair alarm set   Nurse Communication          Time: 8483-8464 OT Time Calculation (min): 19 min  Charges: OT General Charges $OT Visit: 1 Visit OT Treatments $Self Care/Home Management : 8-22 mins  Elston Slot, M.S. OTR/L  07/17/2024, 3:41 PM  ascom [phone]

## 2024-07-17 NOTE — TOC Progression Note (Signed)
 Transition of Care [facility]) - Progression Note    Patient Details  Name: Traci Ballard MRN: [id number] Date of Birth: [birth year]  [facility] CM/SW Contact  Shasta DELENA Daring, RN Phone Number: 07/17/2024, 3:28 PM  Clinical Narrative:     RNCM met with patient and her daughter. Discussed recommendations for SNF placement for rehab. Patient and her daughter are amenable to plan. The patient is planning to relocate to [place]  to be closer to her daughter and while she is in rehab, he daughter will make preparations. Daughter is returning to [place]  tomorrow.  FL2 Complete. SNF search started.  Notified facilities that patient has orders for Bi-PAP    Barriers to Discharge: Continued Medical Work up               Expected Discharge Plan and Services       Living arrangements for the past 2 months: Single Family Home                                       Social Drivers of Health (SDOH) Interventions SDOH Screenings   Food Insecurity: Patient Unable To Answer (07/13/2024)  Housing: Unknown (07/13/2024)  Transportation Needs: Patient Unable To Answer (07/13/2024)  Utilities: Patient Unable To Answer (07/13/2024)  Social Connections: Patient Unable To Answer (07/13/2024)  Tobacco Use: Medium Risk (07/14/2024)    Readmission Risk Interventions     No data to display

## 2024-07-17 NOTE — NC FL2 (Addendum)
 " [place]  MEDICAID FL2 LEVEL OF CARE FORM     IDENTIFICATION  Patient Name: Traci Ballard Birthdate: May 25, 1951 Sex: female Admission Date (Current Location): 07/13/2024  [facility] and [place] Number:  Chiropodist and Address:  [facility], [address] [place], [place] [address]      Provider Number: [id number]  Attending Physician Name and Address:  Kandis Devaughn Sayres, MD  Relative Name and Phone Number:       Current Level of Care: Hospital Recommended Level of Care: Skilled Nursing Facility Prior Approval Number:    Date Approved/Denied:   PASRR Number: [id number]  Discharge Plan: SNF    Current Diagnoses: Patient Active Problem List   Diagnosis Date Noted   Acute respiratory failure with hypoxia and hypercapnia (HCC) 07/13/2024   Elevated blood pressure reading 07/13/2024    Orientation RESPIRATION BLADDER Height & Weight     Situation, Time, Self, Place  O2 (BI-PAP) Incontinent Weight: 47.1 kg Height:  5' 5 (165.1 cm)  BEHAVIORAL SYMPTOMS/MOOD NEUROLOGICAL BOWEL NUTRITION STATUS    Convulsions/Seizures (suspected in 2015) Incontinent Diet (regular)  AMBULATORY STATUS COMMUNICATION OF NEEDS Skin   Extensive Assist Verbally Normal (redness)                       Personal Care Assistance Level of Assistance  Bathing, Feeding, Dressing Bathing Assistance: Limited assistance Feeding assistance: Limited assistance Dressing Assistance: Maximum assistance     Functional Limitations Info  Sight, Hearing, Speech Sight Info: Impaired Hearing Info: Adequate Speech Info: Adequate    SPECIAL CARE FACTORS FREQUENCY  PT (By licensed PT), OT (By licensed OT)     PT Frequency: 5 x week OT Frequency: 5 x week            Contractures Contractures Info: Not present    Additional Factors Info  Code Status, Allergies Code Status Info: full Allergies Info: prednisone, sulfa antibiotics            Current Medications (07/17/2024):  This is the current hospital active medication list Current Facility-Administered Medications  Medication Dose Route Frequency Provider Last Rate Last Admin   acetaminophen  (TYLENOL ) tablet 650 mg  650 mg Oral Q6H PRN Fernand Prost, MD       Or   acetaminophen  (TYLENOL ) suppository 650 mg  650 mg Rectal Q6H PRN Khan, Ghalib, MD       [START ON 07/18/2024] cefTRIAXone  (ROCEPHIN ) 1 g in sodium chloride  0.9 % 100 mL IVPB  1 g Intravenous Q24H Wouk, Devaughn Sayres, MD       enoxaparin (LOVENOX) injection 40 mg  40 mg Subcutaneous Q24H Wouk, Devaughn Sayres, MD   40 mg at 07/17/24 0901   feeding supplement (ENSURE PLUS HIGH PROTEIN) liquid 237 mL  237 mL Oral BID BM Ponnala, Shruthi, MD   237 mL at 07/17/24 0902   [START ON 07/18/2024] Influenza vac split trivalent PF (FLUZONE HIGH-DOSE) injection 0.5 mL  0.5 mL Intramuscular Tomorrow-1000 Wouk, Devaughn Sayres, MD       ipratropium-albuterol  (DUONEB) 0.5-2.5 (3) MG/3ML nebulizer solution 3 mL  3 mL Nebulization TID Kandis Devaughn Sayres, MD   3 mL at 07/17/24 1326   loperamide (IMODIUM) capsule 2 mg  2 mg Oral Q6H PRN Wouk, Devaughn Sayres, MD       multivitamin with minerals tablet 1 tablet  1 tablet Oral Daily Wouk, Devaughn Sayres, MD   1 tablet at 07/17/24 0901   ondansetron  (ZOFRAN ) tablet  4 mg  4 mg Oral Q6H PRN Khan, Ghalib, MD       Or   ondansetron  (ZOFRAN ) injection 4 mg  4 mg Intravenous Q6H PRN Khan, Ghalib, MD       Oral care mouth rinse  15 mL Mouth Rinse 4 times per day Fernand Prost, MD   15 mL at 07/17/24 0902   Oral care mouth rinse  15 mL Mouth Rinse PRN Fernand Prost, MD       NOREEN ON 07/18/2024] predniSONE (DELTASONE) tablet 40 mg  40 mg Oral Q breakfast Wouk, Devaughn Sayres, MD       senna-docusate (Senokot-S) tablet 1 tablet  1 tablet Oral QHS PRN Fernand Prost, MD       sodium chloride  flush (NS) 0.9 % injection 3 mL  3 mL Intravenous Q12H Khan, Ghalib, MD   3 mL at 07/17/24 0902   Vitamin D (Ergocalciferol)  (DRISDOL) 1.25 MG (50000 UNIT) capsule 50,000 Units  50,000 Units Oral Q7 days Jerelene Critchley, MD         Discharge Medications: Please see discharge summary for a list of discharge medications.  Relevant Imaging Results:  Relevant Lab Results:   Additional Information SS#:  [id number]  Shasta DELENA Daring, RN     "

## 2024-07-17 NOTE — Progress Notes (Signed)
 " PROGRESS NOTE    Traci Ballard  FMW:[id number] DOB: 1951-07-05 DOA: 07/13/2024 PCP: Traci Katz, MD  Chief Complaint  Patient presents with   Shortness of Breath    Hospital Course:  Traci Ballard is a 74 y.o. year old female without known medical history presenting to the ED with worsening shortness of breath.  She states has been having shortness of breath for over a week.  She has been having some coughing with sputum production.  Denies any fever or chills but states her body is having difficulty with thermoregulation.  She states she does not have any medical problems and does not take any medications other than over-the-counter vitamin D  Patient admitted for acute hypoxic and hypercapnic respiratory failure, on BiPAP.  Admitted with suspected COPD exacerbation and acute heart failure.  Hospital course as below  Subjective: Breathing comfortably off bipap today. Says breathing feels improved    Objective: Vitals:   07/16/24 2120 07/17/24 0408 07/17/24 0726 07/17/24 0814  BP:  125/63  (!) 121/52  Pulse:  76  77  Resp:  18  18  Temp:  98.4 F (36.9 C)  98.5 F (36.9 C)  TempSrc:  Oral  Oral  SpO2: 96% 96% 95% 90%  Weight:  47.1 kg    Height:        Intake/Output Summary (Last 24 hours) at 07/17/2024 1128 Last data filed at 07/17/2024 0300 Gross per 24 hour  Intake 859.7 ml  Output 100 ml  Net 759.7 ml   Filed Weights   07/15/24 0500 07/16/24 0445 07/17/24 0408  Weight: 47.6 kg 46.9 kg 47.1 kg    Examination: Gen: nad CV: sinus rhythm and tachycardic rate,  Lung: diminished breath sounds, rhonchorous Abd: No TTP, normal bowel sounds MSK: No asymmetry, cachetic appearance  Neuro: alert and oriented  Assessment & Plan:  Acute hypoxic and hypercapnic respiratory failure  2/2 copd exacerbation, possible pna. Co2 elevated, appears will qualify for home bipap Toc to assist with that Continue [place] o2, stable on 2 liters  CHF? Tte with mod mr/tr,  preserved ef, think copd primary driver of sympotms - d/c lasix   Hypernatremia No aki. Resolved with ivf - push hydration  COPD exacerbation CAP No formal diagnosis but symptoms, ct scan, and smoking history suggestive RVP negative CT PE negative for PE.  Diffuse peribronchovascular nodularity and bronchial wall thickening, consistent with airway inflammation On empiric coverage for multifocal pneumonia Viral panels neg Agreeable to steroids, started 12/31 Check sputum for culture, urine antigens Continue ceftriaxone /azithromycin , will complete 5 day course tomorrow  Elevated troponin 26 -> 31, suspect due to demand ischemia Trend troponin  Severe vitamin D deficiency 50,000 units weekly  Diffuse osteopenia with severe anterior wedge compression deformities from T6-T9 and resultant severe kyphotic deformity Denies acute back pain  Debility PT advising snf. Toc consulted   DVT prophylaxis: Lovenox   Code Status: Full Code Disposition:  snf Family: daughter updated @ bedside 1/2  Consultants:  None  Procedures:  None  Antimicrobials:  Anti-infectives (From admission, onward)    Start     Dose/Rate Route Frequency Ordered Stop   07/18/24 0100  cefTRIAXone  (ROCEPHIN ) 1 g in sodium chloride  0.9 % 100 mL IVPB        1 g 200 mL/hr over 30 Minutes Intravenous Every 24 hours 07/17/24 1106 07/19/24 0059   07/16/24 0100  cefTRIAXone  (ROCEPHIN ) 1 g in sodium chloride  0.9 % 100 mL IVPB  Status:  Discontinued  1 g 200 mL/hr over 30 Minutes Intravenous Every 24 hours 07/15/24 1302 07/17/24 1106   07/14/24 0130  azithromycin  (ZITHROMAX ) 500 mg in sodium chloride  0.9 % 250 mL IVPB        500 mg 250 mL/hr over 60 Minutes Intravenous Every 24 hours 07/14/24 0041 07/17/24 0757   07/14/24 0045  cefTRIAXone  (ROCEPHIN ) 2 g in sodium chloride  0.9 % 100 mL IVPB  Status:  Discontinued        2 g 200 mL/hr over 30 Minutes Intravenous Every 24 hours 07/13/24 2348 07/15/24 1302        Data Reviewed: I have personally reviewed following labs and imaging studies CBC: Recent Labs  Lab 07/13/24 1747 07/14/24 0319 07/15/24 0247  WBC 12.4* 10.4 12.4*  NEUTROABS 9.2*  --   --   HGB 15.7* 13.6 13.4  HCT 47.8* 41.1 42.2  MCV 98.8 98.8 102.2*  PLT 383 305 312   Basic Metabolic Panel: Recent Labs  Lab 07/13/24 1747 07/13/24 2307 07/14/24 0319 07/15/24 0247 07/16/24 0408 07/17/24 0350  NA 137  --  140 147* 150* 143  K 3.5  --  4.0 3.9 3.8 3.9  CL 93*  --  95* 98 100 96*  CO2 32  --  34* 41* 44* 41*  GLUCOSE 146*  --  137* 118* 151* 113*  BUN 38*  --  39* 46* 32* 27*  CREATININE 0.59  --  0.53 0.60 0.41* 0.38*  CALCIUM 10.1  --  8.8* 9.2 9.1 8.4*  MG  --  2.4  --  2.7*  --   --    GFR: Estimated Creatinine Clearance: 45.2 mL/min (A) (by C-G formula based on SCr of 0.38 mg/dL (L)). Liver Function Tests: Recent Labs  Lab 07/13/24 1747  AST 19  ALT 21  ALKPHOS 128*  BILITOT 0.8  PROT 7.8  ALBUMIN 4.3   CBG: Recent Labs  Lab 07/16/24 0852 07/16/24 1147 07/16/24 1634 07/16/24 2100 07/17/24 0812  GLUCAP 135* 221* 223* 145* 89    Recent Results (from the past 240 hours)  Resp panel by RT-PCR (RSV, Flu A&B, Covid) Anterior Nasal Swab     Status: None   Collection Time: 07/13/24  5:45 PM   Specimen: Anterior Nasal Swab  Result Value Ref Range Status   SARS Coronavirus 2 by RT PCR NEGATIVE NEGATIVE Final    Comment: (NOTE) SARS-CoV-2 target nucleic acids are NOT DETECTED.  The SARS-CoV-2 RNA is generally detectable in upper respiratory specimens during the acute phase of infection. The lowest concentration of SARS-CoV-2 viral copies this assay can detect is 138 copies/mL. A negative result does not preclude SARS-Cov-2 infection and should not be used as the sole basis for treatment or other patient management decisions. A negative result may occur with  improper specimen collection/handling, submission of specimen other than  nasopharyngeal swab, presence of viral mutation(s) within the areas targeted by this assay, and inadequate number of viral copies(<138 copies/mL). A negative result must be combined with clinical observations, patient history, and epidemiological information. The expected result is Negative.  Fact Sheet for Patients:  bloggercourse.com  Fact Sheet for Healthcare Providers:  seriousbroker.it  This test is no t yet approved or cleared by the [place]  FDA and  has been authorized for detection and/or diagnosis of SARS-CoV-2 by FDA under an Emergency Use Authorization (EUA). This EUA will remain  in effect (meaning this test can be used) for the duration of the COVID-19 declaration under Section 564(b)(1)  of the Act, 21 U.S.C.section 360bbb-3(b)(1), unless the authorization is terminated  or revoked sooner.       Influenza A by PCR NEGATIVE NEGATIVE Final   Influenza B by PCR NEGATIVE NEGATIVE Final    Comment: (NOTE) The Xpert Xpress SARS-CoV-2/FLU/RSV plus assay is intended as an aid in the diagnosis of influenza from Nasopharyngeal swab specimens and should not be used as a sole basis for treatment. Nasal washings and aspirates are unacceptable for Xpert Xpress SARS-CoV-2/FLU/RSV testing.  Fact Sheet for Patients: bloggercourse.com  Fact Sheet for Healthcare Providers: seriousbroker.it  This test is not yet approved or cleared by the [place]  FDA and has been authorized for detection and/or diagnosis of SARS-CoV-2 by FDA under an Emergency Use Authorization (EUA). This EUA will remain in effect (meaning this test can be used) for the duration of the COVID-19 declaration under Section 564(b)(1) of the Act, 21 U.S.C. section 360bbb-3(b)(1), unless the authorization is terminated or revoked.     Resp Syncytial Virus by PCR NEGATIVE NEGATIVE Final    Comment:  (NOTE) Fact Sheet for Patients: bloggercourse.com  Fact Sheet for Healthcare Providers: seriousbroker.it  This test is not yet approved or cleared by the [place]  FDA and has been authorized for detection and/or diagnosis of SARS-CoV-2 by FDA under an Emergency Use Authorization (EUA). This EUA will remain in effect (meaning this test can be used) for the duration of the COVID-19 declaration under Section 564(b)(1) of the Act, 21 U.S.C. section 360bbb-3(b)(1), unless the authorization is terminated or revoked.  Performed at [facility], [address] [place], [place] [address]   MRSA Next Gen by PCR, Nasal     Status: None   Collection Time: 07/13/24 10:53 PM   Specimen: Nasal Mucosa; Nasal Swab  Result Value Ref Range Status   MRSA by PCR Next Gen NOT DETECTED NOT DETECTED Final    Comment: (NOTE) The GeneXpert MRSA Assay (FDA approved for NASAL specimens only), is one component of a comprehensive MRSA colonization surveillance program. It is not intended to diagnose MRSA infection nor to guide or monitor treatment for MRSA infections. Test performance is not FDA approved in patients less than 59 years old. Performed at [facility], [address] [place], [place] [address]   Respiratory (~20 pathogens) panel by PCR     Status: None   Collection Time: 07/14/24 12:09 PM   Specimen: Nasopharyngeal Swab; Respiratory  Result Value Ref Range Status   Adenovirus NOT DETECTED NOT DETECTED Final   Coronavirus 229E NOT DETECTED NOT DETECTED Final    Comment: (NOTE) The Coronavirus on the Respiratory Panel, DOES NOT test for the novel  Coronavirus (2019 nCoV)    Coronavirus HKU1 NOT DETECTED NOT DETECTED Final   Coronavirus NL63 NOT DETECTED NOT DETECTED Final   Coronavirus OC43 NOT DETECTED NOT DETECTED Final   Metapneumovirus NOT DETECTED NOT DETECTED Final   Rhinovirus / Enterovirus NOT DETECTED NOT  DETECTED Final   Influenza A NOT DETECTED NOT DETECTED Final   Influenza B NOT DETECTED NOT DETECTED Final   Parainfluenza Virus 1 NOT DETECTED NOT DETECTED Final   Parainfluenza Virus 2 NOT DETECTED NOT DETECTED Final   Parainfluenza Virus 3 NOT DETECTED NOT DETECTED Final   Parainfluenza Virus 4 NOT DETECTED NOT DETECTED Final   Respiratory Syncytial Virus NOT DETECTED NOT DETECTED Final   Bordetella pertussis NOT DETECTED NOT DETECTED Final   Bordetella Parapertussis NOT DETECTED NOT DETECTED Final   Chlamydophila pneumoniae NOT DETECTED NOT  DETECTED Final   Mycoplasma pneumoniae NOT DETECTED NOT DETECTED Final    Comment: Performed at [facility], [address], [place], [place] [address]  Expectorated Sputum Assessment w Gram Stain, Rflx to Resp Cult     Status: None   Collection Time: 07/15/24  4:00 PM   Specimen: Expectorated Sputum  Result Value Ref Range Status   Specimen Description EXPECTORATED SPUTUM  Final   Special Requests NONE  Final   Sputum evaluation   Final    THIS SPECIMEN IS ACCEPTABLE FOR SPUTUM CULTURE Performed at [facility], [address], [place], [place] [address]    Report Status 07/15/2024 FINAL  Final  Culture, Respiratory w Gram Stain     Status: None (Preliminary result)   Collection Time: 07/15/24  4:00 PM  Result Value Ref Range Status   Specimen Description   Final    EXPECTORATED SPUTUM Performed at [facility], [address], [place], [place] [address]    Special Requests   Final    NONE Reflexed from [id number] Performed at [facility], [address] [place], [place] [address]    Gram Stain RARE WBC SEEN NO ORGANISMS SEEN   Final   Culture   Final    CULTURE REINCUBATED FOR BETTER GROWTH Performed at [facility], [address] [place], [place] [address]    Report Status PENDING  Incomplete     Radiology Studies: No results found.   Scheduled Meds:  enoxaparin (LOVENOX) injection   40 mg Subcutaneous Q24H   feeding supplement  237 mL Oral BID BM   [START ON 07/18/2024] Influenza vac split trivalent PF  0.5 mL Intramuscular Tomorrow-1000   ipratropium-albuterol   3 mL Nebulization TID   multivitamin with minerals  1 tablet Oral Daily   mouth rinse  15 mL Mouth Rinse 4 times per day   predniSONE  40 mg Oral Q breakfast   sodium chloride  flush  3 mL Intravenous Q12H   Vitamin D (Ergocalciferol)  50,000 Units Oral Q7 days   Continuous Infusions:  [START ON 07/18/2024] cefTRIAXone  (ROCEPHIN )  IV       LOS: 4 days   Devaughn KATHEE Ban, MD [facility]  To contact the attending physician between 7A-7P please use Epic Chat. To contact the covering physician during after hours 7P-7A, please review Amion.  07/17/2024, 11:28 AM     "

## 2024-07-18 DIAGNOSIS — J9602 Acute respiratory failure with hypercapnia: Secondary | ICD-10-CM | POA: Diagnosis not present

## 2024-07-18 DIAGNOSIS — J9601 Acute respiratory failure with hypoxia: Secondary | ICD-10-CM | POA: Diagnosis not present

## 2024-07-18 LAB — CULTURE, RESPIRATORY W GRAM STAIN: Culture: NORMAL

## 2024-07-18 LAB — GLUCOSE, CAPILLARY
Glucose-Capillary: 104 mg/dL — ABNORMAL HIGH (ref 70–99)
Glucose-Capillary: 142 mg/dL — ABNORMAL HIGH (ref 70–99)
Glucose-Capillary: 185 mg/dL — ABNORMAL HIGH (ref 70–99)

## 2024-07-18 LAB — BASIC METABOLIC PANEL WITH GFR
Anion gap: 3 — ABNORMAL LOW (ref 5–15)
BUN: 17 mg/dL (ref 8–23)
CO2: 38 mmol/L — ABNORMAL HIGH (ref 22–32)
Calcium: 8.3 mg/dL — ABNORMAL LOW (ref 8.9–10.3)
Chloride: 95 mmol/L — ABNORMAL LOW (ref 98–111)
Creatinine, Ser: 0.36 mg/dL — ABNORMAL LOW (ref 0.44–1.00)
GFR, Estimated: >60 mL/min
Glucose, Bld: 95 mg/dL (ref 70–99)
Potassium: 4.2 mmol/L (ref 3.5–5.1)
Sodium: 136 mmol/L (ref 135–145)

## 2024-07-18 MED ORDER — PREDNISONE 20 MG PO TABS
40.0000 mg | ORAL_TABLET | Freq: Every day | ORAL | Status: AC
  Administered 2024-07-19 – 2024-07-20 (×2): 40 mg via ORAL
  Filled 2024-07-18 (×2): qty 2

## 2024-07-18 MED ORDER — IPRATROPIUM-ALBUTEROL 0.5-2.5 (3) MG/3ML IN SOLN
3.0000 mL | Freq: Four times a day (QID) | RESPIRATORY_TRACT | Status: DC | PRN
  Administered 2024-07-18: 3 mL via RESPIRATORY_TRACT
  Filled 2024-07-18: qty 3

## 2024-07-18 NOTE — Progress Notes (Addendum)
 " PROGRESS NOTE    Traci Ballard  FMW:[id number] DOB: [birth year] DOA: 07/13/2024 PCP: Tanda Katz, MD  Chief Complaint  Patient presents with   Shortness of Breath    Hospital Course:  Traci Ballard is a 74 y.o. year old female without known medical history presenting to the ED with worsening shortness of breath.  She states has been having shortness of breath for over a week.  She has been having some coughing with sputum production.  Denies any fever or chills but states her body is having difficulty with thermoregulation.  She states she does not have any medical problems and does not take any medications other than over-the-counter vitamin D   Patient admitted for acute hypoxic and hypercapnic respiratory failure, on BiPAP.  Admitted with suspected COPD exacerbation and acute heart failure.  Hospital course as below  Subjective: Breathing stable, tolerating diet    Objective: Vitals:   07/17/24 2158 07/18/24 0434 07/18/24 0438 07/18/24 0833  BP:   117/62 135/69  Pulse:   66 85  Resp:   16 18  Temp:   98.5 F (36.9 C) 98.2 F (36.8 C)  TempSrc:      SpO2: 97%  91% 92%  Weight:  47.4 kg    Height:        Intake/Output Summary (Last 24 hours) at 07/18/2024 1313 Last data filed at 07/18/2024 0927 Gross per 24 hour  Intake 480 ml  Output 400 ml  Net 80 ml   Filed Weights   07/16/24 0445 07/17/24 0408 07/18/24 0434  Weight: 46.9 kg 47.1 kg 47.4 kg    Examination: Gen: nad CV: rrr no murmur Lung: diminished breath sounds, rhonchorous. Stable. No increased wob Abd: No TTP, normal bowel sounds MSK: No asymmetry, cachetic appearance  Neuro: alert and oriented  Assessment & Plan:  Acute hypoxic and hypercapnic respiratory failure  2/2 copd exacerbation, possible pna. Co2 elevated, appears will qualify for home bipap Toc to assist with outpt bipap Continue Farmer o2, stable on 2 liters, will likely d/c on that  CHF? Tte with mod mr/tr, preserved ef, think  copd primary driver of sympotms - d/c lasix   Hypernatremia No aki. Resolved with ivf, now stable off fluids  COPD exacerbation CAP No formal diagnosis but symptoms, ct scan, and smoking history suggestive RVP negative CT PE negative for PE.  Diffuse peribronchovascular nodularity and bronchial wall thickening, consistent with airway inflammation Viral panels neg Agreeable to steroids, started 12/31, will continue for 5 day course Check sputum for culture, urine antigens Continue ceftriaxone /azithromycin , will complete 5 day course today  Elevated troponin 26 -> 31, suspect due to demand ischemia Trend troponin  Severe vitamin D  deficiency 50,000 units weekly  Diffuse osteopenia with severe anterior wedge compression deformities from T6-T9 and resultant severe kyphotic deformity Denies acute back pain  Debility PT advising snf. Toc consulted, bed search underway   DVT prophylaxis: Lovenox    Code Status: Full Code Disposition:  snf Family: daughter updated @ bedside 1/3  Consultants:  None  Procedures:  None  Antimicrobials:  Anti-infectives (From admission, onward)    Start     Dose/Rate Route Frequency Ordered Stop   07/18/24 0100  cefTRIAXone  (ROCEPHIN ) 1 g in sodium chloride  0.9 % 100 mL IVPB        1 g 200 mL/hr over 30 Minutes Intravenous Every 24 hours 07/17/24 1106 07/18/24 0033   07/16/24 0100  cefTRIAXone  (ROCEPHIN ) 1 g in sodium chloride  0.9 % 100 mL IVPB  Status:  Discontinued        1 g 200 mL/hr over 30 Minutes Intravenous Every 24 hours 07/15/24 1302 07/17/24 1106   07/14/24 0130  azithromycin  (ZITHROMAX ) 500 mg in sodium chloride  0.9 % 250 mL IVPB        500 mg 250 mL/hr over 60 Minutes Intravenous Every 24 hours 07/14/24 0041 07/17/24 0757   07/14/24 0045  cefTRIAXone  (ROCEPHIN ) 2 g in sodium chloride  0.9 % 100 mL IVPB  Status:  Discontinued        2 g 200 mL/hr over 30 Minutes Intravenous Every 24 hours 07/13/24 2348 07/15/24 1302        Data Reviewed: I have personally reviewed following labs and imaging studies CBC: Recent Labs  Lab 07/13/24 1747 07/14/24 0319 07/15/24 0247  WBC 12.4* 10.4 12.4*  NEUTROABS 9.2*  --   --   HGB 15.7* 13.6 13.4  HCT 47.8* 41.1 42.2  MCV 98.8 98.8 102.2*  PLT 383 305 312   Basic Metabolic Panel: Recent Labs  Lab 07/13/24 2307 07/14/24 0319 07/15/24 0247 07/16/24 0408 07/17/24 0350 07/18/24 0529  NA  --  140 147* 150* 143 136  K  --  4.0 3.9 3.8 3.9 4.2  CL  --  95* 98 100 96* 95*  CO2  --  34* 41* 44* 41* 38*  GLUCOSE  --  137* 118* 151* 113* 95  BUN  --  39* 46* 32* 27* 17  CREATININE  --  0.53 0.60 0.41* 0.38* 0.36*  CALCIUM  --  8.8* 9.2 9.1 8.4* 8.3*  MG 2.4  --  2.7*  --   --   --    GFR: Estimated Creatinine Clearance: 45.5 mL/min (A) (by C-G formula based on SCr of 0.36 mg/dL (L)). Liver Function Tests: Recent Labs  Lab 07/13/24 1747  AST 19  ALT 21  ALKPHOS 128*  BILITOT 0.8  PROT 7.8  ALBUMIN 4.3   CBG: Recent Labs  Lab 07/17/24 1208 07/17/24 1703 07/17/24 2230 07/18/24 0834 07/18/24 1146  GLUCAP 139* 178* 112* 104* 142*    Recent Results (from the past 240 hours)  Resp panel by RT-PCR (RSV, Flu A&B, Covid) Anterior Nasal Swab     Status: None   Collection Time: 07/13/24  5:45 PM   Specimen: Anterior Nasal Swab  Result Value Ref Range Status   SARS Coronavirus 2 by RT PCR NEGATIVE NEGATIVE Final    Comment: (NOTE) SARS-CoV-2 target nucleic acids are NOT DETECTED.  The SARS-CoV-2 RNA is generally detectable in upper respiratory specimens during the acute phase of infection. The lowest concentration of SARS-CoV-2 viral copies this assay can detect is 138 copies/mL. A negative result does not preclude SARS-Cov-2 infection and should not be used as the sole basis for treatment or other patient management decisions. A negative result may occur with  improper specimen collection/handling, submission of specimen other than nasopharyngeal  swab, presence of viral mutation(s) within the areas targeted by this assay, and inadequate number of viral copies(<138 copies/mL). A negative result must be combined with clinical observations, patient history, and epidemiological information. The expected result is Negative.  Fact Sheet for Patients:  bloggercourse.com  Fact Sheet for Healthcare Providers:  seriousbroker.it  This test is no t yet approved or cleared by the [place]  FDA and  has been authorized for detection and/or diagnosis of SARS-CoV-2 by FDA under an Emergency Use Authorization (EUA). This EUA will remain  in effect (meaning this test can be used)  for the duration of the COVID-19 declaration under Section 564(b)(1) of the Act, 21 U.S.C.section 360bbb-3(b)(1), unless the authorization is terminated  or revoked sooner.       Influenza A by PCR NEGATIVE NEGATIVE Final   Influenza B by PCR NEGATIVE NEGATIVE Final    Comment: (NOTE) The Xpert Xpress SARS-CoV-2/FLU/RSV plus assay is intended as an aid in the diagnosis of influenza from Nasopharyngeal swab specimens and should not be used as a sole basis for treatment. Nasal washings and aspirates are unacceptable for Xpert Xpress SARS-CoV-2/FLU/RSV testing.  Fact Sheet for Patients: bloggercourse.com  Fact Sheet for Healthcare Providers: seriousbroker.it  This test is not yet approved or cleared by the [place]  FDA and has been authorized for detection and/or diagnosis of SARS-CoV-2 by FDA under an Emergency Use Authorization (EUA). This EUA will remain in effect (meaning this test can be used) for the duration of the COVID-19 declaration under Section 564(b)(1) of the Act, 21 U.S.C. section 360bbb-3(b)(1), unless the authorization is terminated or revoked.     Resp Syncytial Virus by PCR NEGATIVE NEGATIVE Final    Comment: (NOTE) Fact Sheet for  Patients: bloggercourse.com  Fact Sheet for Healthcare Providers: seriousbroker.it  This test is not yet approved or cleared by the [place]  FDA and has been authorized for detection and/or diagnosis of SARS-CoV-2 by FDA under an Emergency Use Authorization (EUA). This EUA will remain in effect (meaning this test can be used) for the duration of the COVID-19 declaration under Section 564(b)(1) of the Act, 21 U.S.C. section 360bbb-3(b)(1), unless the authorization is terminated or revoked.  Performed at [facility], [address] [place], [place] [address]   MRSA Next Gen by PCR, Nasal     Status: None   Collection Time: 07/13/24 10:53 PM   Specimen: Nasal Mucosa; Nasal Swab  Result Value Ref Range Status   MRSA by PCR Next Gen NOT DETECTED NOT DETECTED Final    Comment: (NOTE) The GeneXpert MRSA Assay (FDA approved for NASAL specimens only), is one component of a comprehensive MRSA colonization surveillance program. It is not intended to diagnose MRSA infection nor to guide or monitor treatment for MRSA infections. Test performance is not FDA approved in patients less than 38 years old. Performed at [facility], [address] [place], [place] [address]   Respiratory (~20 pathogens) panel by PCR     Status: None   Collection Time: 07/14/24 12:09 PM   Specimen: Nasopharyngeal Swab; Respiratory  Result Value Ref Range Status   Adenovirus NOT DETECTED NOT DETECTED Final   Coronavirus 229E NOT DETECTED NOT DETECTED Final    Comment: (NOTE) The Coronavirus on the Respiratory Panel, DOES NOT test for the novel  Coronavirus (2019 nCoV)    Coronavirus HKU1 NOT DETECTED NOT DETECTED Final   Coronavirus NL63 NOT DETECTED NOT DETECTED Final   Coronavirus OC43 NOT DETECTED NOT DETECTED Final   Metapneumovirus NOT DETECTED NOT DETECTED Final   Rhinovirus / Enterovirus NOT DETECTED NOT DETECTED Final    Influenza A NOT DETECTED NOT DETECTED Final   Influenza B NOT DETECTED NOT DETECTED Final   Parainfluenza Virus 1 NOT DETECTED NOT DETECTED Final   Parainfluenza Virus 2 NOT DETECTED NOT DETECTED Final   Parainfluenza Virus 3 NOT DETECTED NOT DETECTED Final   Parainfluenza Virus 4 NOT DETECTED NOT DETECTED Final   Respiratory Syncytial Virus NOT DETECTED NOT DETECTED Final   Bordetella pertussis NOT DETECTED NOT DETECTED Final   Bordetella Parapertussis NOT DETECTED  NOT DETECTED Final   Chlamydophila pneumoniae NOT DETECTED NOT DETECTED Final   Mycoplasma pneumoniae NOT DETECTED NOT DETECTED Final    Comment: Performed at [facility], [address] [place], [place] [address]  Expectorated Sputum Assessment w Gram Stain, Rflx to Resp Cult     Status: None   Collection Time: 07/15/24  4:00 PM   Specimen: Expectorated Sputum  Result Value Ref Range Status   Specimen Description EXPECTORATED SPUTUM  Final   Special Requests NONE  Final   Sputum evaluation   Final    THIS SPECIMEN IS ACCEPTABLE FOR SPUTUM CULTURE Performed at [facility], [address], [place], [place] [address]    Report Status 07/15/2024 FINAL  Final  Culture, Respiratory w Gram Stain     Status: None (Preliminary result)   Collection Time: 07/15/24  4:00 PM  Result Value Ref Range Status   Specimen Description   Final    EXPECTORATED SPUTUM Performed at [facility], [address], [place], [place] [address]    Special Requests   Final    NONE Reflexed from [id number] Performed at [facility], [address] [place], [place] [address]    Gram Stain RARE WBC SEEN NO ORGANISMS SEEN   Final   Culture   Final    CULTURE REINCUBATED FOR BETTER GROWTH Performed at [facility], [address], [place], [place] [address]    Report Status PENDING  Incomplete     Radiology Studies: No results found.   Scheduled Meds:  enoxaparin  (LOVENOX ) injection  40 mg  Subcutaneous Q24H   feeding supplement  237 mL Oral BID BM   Influenza vac split trivalent PF  0.5 mL Intramuscular Tomorrow-1000   ipratropium-albuterol   3 mL Nebulization TID   multivitamin with minerals  1 tablet Oral Daily   mouth rinse  15 mL Mouth Rinse 4 times per day   predniSONE   40 mg Oral Q breakfast   sodium chloride  flush  3 mL Intravenous Q12H   Vitamin D  (Ergocalciferol )  50,000 Units Oral Q7 days   Continuous Infusions:     LOS: 5 days   Devaughn KATHEE Ban, MD [facility]  To contact the attending physician between 7A-7P please use Epic Chat. To contact the covering physician during after hours 7P-7A, please review Amion.  07/18/2024, 1:13 PM     "

## 2024-07-18 NOTE — TOC Progression Note (Signed)
 Transition of Care [facility]) - Progression Note    Patient Details  Name: Traci Ballard MRN: [id number] Date of Birth: 1951-02-26  Transition of Care [facility]) CM/SW Contact  Victory Jackquline RAMAN, RN Phone Number: 07/18/2024, 5:14 PM  Clinical Narrative:   RNCM spoke to patient's daughter @ [phone], introduced myself and my role and explained that discharge planning would be discussed. RNCM reviewed bed offers with her and she said that she would review them with her mom and get back to me. She also asked if we could submit for bed offers in [place], [place]. Referral's sent via EPIC. RNCM will continue to follow for discharge planning needs.       Barriers to Discharge: Continued Medical Work up               Expected Discharge Plan and Services       Living arrangements for the past 2 months: Single Family Home                                       Social Drivers of Health (SDOH) Interventions SDOH Screenings   Food Insecurity: Patient Unable To Answer (07/13/2024)  Housing: Unknown (07/13/2024)  Transportation Needs: Patient Unable To Answer (07/13/2024)  Utilities: Patient Unable To Answer (07/13/2024)  Social Connections: Patient Unable To Answer (07/13/2024)  Tobacco Use: Medium Risk (07/14/2024)    Readmission Risk Interventions     No data to display

## 2024-07-18 NOTE — Plan of Care (Signed)

## 2024-07-19 DIAGNOSIS — J9601 Acute respiratory failure with hypoxia: Secondary | ICD-10-CM | POA: Diagnosis not present

## 2024-07-19 DIAGNOSIS — J9602 Acute respiratory failure with hypercapnia: Secondary | ICD-10-CM | POA: Diagnosis not present

## 2024-07-19 LAB — GLUCOSE, CAPILLARY
Glucose-Capillary: 87 mg/dL (ref 70–99)
Glucose-Capillary: 121 mg/dL — ABNORMAL HIGH (ref 70–99)
Glucose-Capillary: 152 mg/dL — ABNORMAL HIGH (ref 70–99)

## 2024-07-19 NOTE — TOC Progression Note (Signed)
 Transition of Care [facility]) - Progression Note    Patient Details  Name: Traci Ballard MRN: [id number] Date of Birth: 13-Jan-1951  Transition of [facility]) CM/SW Contact  Victory Jackquline RAMAN, RN Phone Number: 07/19/2024, 4:26 PM  Clinical Narrative:   RNCM met with patient at the bedside with her daughter Delon on speaker phone. Reviewed bed offers with them both and patient's daughter looked up the reviews for all five SNF's and recommended [facility] and [facility] to her mom and she picked [facility]. MD made aware. Message sent to CMA via secure chat requesting Auth be started. Patient also informed me that she has Location Manager for [employer]. Business office made aware. RNCM will continue to follow for discharge planning needs.      Barriers to Discharge: Continued Medical Work up               Expected Discharge Plan and Services       Living arrangements for the past 2 months: Single Family Home                                       Social Drivers of Health (SDOH) Interventions SDOH Screenings   Food Insecurity: Patient Unable To Answer (07/13/2024)  Housing: Unknown (07/13/2024)  Transportation Needs: Patient Unable To Answer (07/13/2024)  Utilities: Patient Unable To Answer (07/13/2024)  Social Connections: Patient Unable To Answer (07/13/2024)  Tobacco Use: Medium Risk (07/14/2024)    Readmission Risk Interventions     No data to display

## 2024-07-19 NOTE — Plan of Care (Signed)

## 2024-07-19 NOTE — Progress Notes (Signed)
 Physical Therapy Treatment Patient Details Name: Traci Ballard MRN: [id number] DOB: 1950-12-28 Today's Date: 07/19/2024   History of Present Illness Patient is a 74 year old female with acute respiratory failure with hypoxia and hypercapnia. No known medical history.    PT Comments  Pt is able to get to EOB with bed features and transfer to [facility] with RW and cga x 1.  After voiding she is able to walk around bed and stop at sink to wash and light pericare.  She takes a short seated rest before standing ex.  HR and O2 stable today but does endorse general fatigue.  Pt stated she lives alone.  < 3 hrs a day therapy remains appropriate at discharge    If plan is discharge home, recommend the following: A little help with walking and/or transfers;A little help with bathing/dressing/bathroom;Help with stairs or ramp for entrance;Assist for transportation;Assistance with cooking/housework   Can travel by private vehicle     Yes  Equipment Recommendations  Rolling walker (2 wheels)    Recommendations for Other Services       Precautions / Restrictions Precautions Precautions: Fall Recall of Precautions/Restrictions: Impaired Restrictions Weight Bearing Restrictions Per Provider Order: No     Mobility  Bed Mobility Overal bed mobility: Needs Assistance Bed Mobility: Supine to Sit, Sit to Supine     Supine to sit: Contact guard, HOB elevated, Used rails Sit to supine: Contact guard assist, Used rails, HOB elevated     Patient Response: Cooperative  Transfers Overall transfer level: Needs assistance Equipment used: Rolling walker (2 wheels) Transfers: Sit to/from Stand Sit to Stand: Contact guard assist   Step pivot transfers: Contact guard assist            Ambulation/Gait Ambulation/Gait assistance: Min assist Gait 20' Assistive device: Rolling walker (2 wheels) Gait Pattern/deviations: Decreased stride length, Step-through pattern, Shuffle, Trunk flexed,  Decreased step length - left, Decreased stance time - right Gait velocity: dec     General Gait Details: cues to keep close to walker   Stairs             Wheelchair Mobility     Tilt Bed Tilt Bed Patient Response: Cooperative  Modified Rankin (Stroke Patients Only)       Balance Overall balance assessment: Needs assistance Sitting-balance support: No upper extremity supported, Feet supported Sitting balance-Leahy Scale: Good     Standing balance support: Bilateral upper extremity supported, During functional activity Standing balance-Leahy Scale: Fair                              Hotel Manager: No apparent difficulties  Cognition Arousal: Alert Behavior During Therapy: WFL for tasks assessed/performed   PT - Cognitive impairments: No family/caregiver present to determine baseline                         Following commands: Impaired Following commands impaired: Follows one step commands with increased time    Cueing Cueing Techniques: Verbal cues, Tactile cues  Exercises      General Comments        Pertinent Vitals/Pain Pain Assessment Pain Assessment: No/denies pain    Home Living                          Prior Function            PT  Goals (current goals can now be found in the care plan section) Progress towards PT goals: Progressing toward goals    Frequency    Min 2X/week      PT Plan      Co-evaluation              AM-PAC PT 6 Clicks Mobility   Outcome Measure  Help needed turning from your back to your side while in a flat bed without using bedrails?: None Help needed moving from lying on your back to sitting on the side of a flat bed without using bedrails?: A Little Help needed moving to and from a bed to a chair (including a wheelchair)?: A Little Help needed standing up from a chair using your arms (e.g., wheelchair or bedside chair)?: A Little Help  needed to walk in hospital room?: A Little Help needed climbing 3-5 steps with a railing? : A Lot 6 Click Score: 18    End of Session Equipment Utilized During Treatment: Gait belt Activity Tolerance: Patient tolerated treatment well;Patient limited by fatigue Patient left: with call bell/phone within reach;with family/visitor present;in bed;with bed alarm set Nurse Communication: Mobility status PT Visit Diagnosis: Muscle weakness (generalized) (M62.81);Unsteadiness on feet (R26.81)     Time: 8977-8958 PT Time Calculation (min) (ACUTE ONLY): 19 min  Charges:    $Gait Training: 8-22 mins PT General Charges $$ ACUTE PT VISIT: 1 Visit                   Lauraine Gills, PTA 07/19/2024, 12:15 PM

## 2024-07-19 NOTE — Progress Notes (Signed)
 " PROGRESS NOTE    Traci Ballard  FMW:[id number] DOB: 1951/01/19 DOA: 07/13/2024 PCP: Tanda Katz, MD  Chief Complaint  Patient presents with   Shortness of Breath    Hospital Course:  Traci Ballard is a 74 y.o. year old female without known medical history presenting to the ED with worsening shortness of breath.  She states has been having shortness of breath for over a week.  She has been having some coughing with sputum production.  Denies any fever or chills but states her body is having difficulty with thermoregulation.  She states she does not have any medical problems and does not take any medications other than over-the-counter vitamin D   Patient admitted for acute hypoxic and hypercapnic respiratory failure, on BiPAP.  Admitted with suspected COPD exacerbation and acute heart failure.  Hospital course as below  Subjective: Breathing stable, tolerating diet. No new issues    Objective: Vitals:   07/18/24 1746 07/18/24 1955 07/19/24 0419 07/19/24 0720  BP: 127/72 (!) 128/112 118/65 114/60  Pulse: 86 84 69 74  Resp: 17 16 16 20   Temp: 99.6 F (37.6 C)  98.7 F (37.1 C) 98.8 F (37.1 C)  TempSrc:    Oral  SpO2: 91% 92% 91% 90%  Weight:   48.2 kg   Height:        Intake/Output Summary (Last 24 hours) at 07/19/2024 1511 Last data filed at 07/19/2024 0015 Gross per 24 hour  Intake 200 ml  Output 200 ml  Net 0 ml   Filed Weights   07/17/24 0408 07/18/24 0434 07/19/24 0419  Weight: 47.1 kg 47.4 kg 48.2 kg    Examination: Gen: nad CV: rrr no murmur Lung: diminished breath sounds, rhonchorous. Stable. No increased wob Abd: No TTP, normal bowel sounds MSK: No asymmetry, cachetic appearance  Neuro: alert and oriented  Assessment & Plan:  Acute hypoxic and hypercapnic respiratory failure  2/2 copd exacerbation, possible pna. Co2 elevated, appears will qualify for home bipap Toc to assist with outpt bipap Continue [place] o2, stable on 2 liters, will  likely d/c on that  CHF? Tte with mod mr/tr, preserved ef, think copd primary driver of sympotms Stable off lasix   Hypernatremia No aki. Resolved with ivf, now stable off fluids  COPD exacerbation CAP No formal diagnosis but symptoms, ct scan, and smoking history suggestive RVP negative CT PE negative for PE.  Diffuse peribronchovascular nodularity and bronchial wall thickening, consistent with airway inflammation Viral panels neg Agreeable to steroids, started 12/31, will continue for 5 day course, last dose today Sputum culture normal flora Completed 5 day course ceftriaxone /azithromycin    Elevated troponin 26 -> 31, suspect due to demand ischemia Trend troponin  Severe vitamin D  deficiency 50,000 units weekly  Diffuse osteopenia with severe anterior wedge compression deformities from T6-T9 and resultant severe kyphotic deformity Denies acute back pain  Debility PT advising snf. Toc consulted, bed search underway   DVT prophylaxis: Lovenox    Code Status: Full Code Disposition:  snf Family: daughter updated @ bedside 1/3  Consultants:  None  Procedures:  None  Antimicrobials:  Anti-infectives (From admission, onward)    Start     Dose/Rate Route Frequency Ordered Stop   07/18/24 0100  cefTRIAXone  (ROCEPHIN ) 1 g in sodium chloride  0.9 % 100 mL IVPB        1 g 200 mL/hr over 30 Minutes Intravenous Every 24 hours 07/17/24 1106 07/18/24 0033   07/16/24 0100  cefTRIAXone  (ROCEPHIN ) 1 g in sodium  chloride 0.9 % 100 mL IVPB  Status:  Discontinued        1 g 200 mL/hr over 30 Minutes Intravenous Every 24 hours 07/15/24 1302 07/17/24 1106   07/14/24 0130  azithromycin  (ZITHROMAX ) 500 mg in sodium chloride  0.9 % 250 mL IVPB        500 mg 250 mL/hr over 60 Minutes Intravenous Every 24 hours 07/14/24 0041 07/17/24 0757   07/14/24 0045  cefTRIAXone  (ROCEPHIN ) 2 g in sodium chloride  0.9 % 100 mL IVPB  Status:  Discontinued        2 g 200 mL/hr over 30 Minutes Intravenous  Every 24 hours 07/13/24 2348 07/15/24 1302       Data Reviewed: I have personally reviewed following labs and imaging studies CBC: Recent Labs  Lab 07/13/24 1747 07/14/24 0319 07/15/24 0247  WBC 12.4* 10.4 12.4*  NEUTROABS 9.2*  --   --   HGB 15.7* 13.6 13.4  HCT 47.8* 41.1 42.2  MCV 98.8 98.8 102.2*  PLT 383 305 312   Basic Metabolic Panel: Recent Labs  Lab 07/13/24 2307 07/14/24 0319 07/15/24 0247 07/16/24 0408 07/17/24 0350 07/18/24 0529  NA  --  140 147* 150* 143 136  K  --  4.0 3.9 3.8 3.9 4.2  CL  --  95* 98 100 96* 95*  CO2  --  34* 41* 44* 41* 38*  GLUCOSE  --  137* 118* 151* 113* 95  BUN  --  39* 46* 32* 27* 17  CREATININE  --  0.53 0.60 0.41* 0.38* 0.36*  CALCIUM  --  8.8* 9.2 9.1 8.4* 8.3*  MG 2.4  --  2.7*  --   --   --    GFR: Estimated Creatinine Clearance: 46.2 mL/min (A) (by C-G formula based on SCr of 0.36 mg/dL (L)). Liver Function Tests: Recent Labs  Lab 07/13/24 1747  AST 19  ALT 21  ALKPHOS 128*  BILITOT 0.8  PROT 7.8  ALBUMIN 4.3   CBG: Recent Labs  Lab 07/18/24 0834 07/18/24 1146 07/18/24 1957 07/19/24 0841 07/19/24 1157  GLUCAP 104* 142* 185* 87 121*    Recent Results (from the past 240 hours)  Resp panel by RT-PCR (RSV, Flu A&B, Covid) Anterior Nasal Swab     Status: None   Collection Time: 07/13/24  5:45 PM   Specimen: Anterior Nasal Swab  Result Value Ref Range Status   SARS Coronavirus 2 by RT PCR NEGATIVE NEGATIVE Final    Comment: (NOTE) SARS-CoV-2 target nucleic acids are NOT DETECTED.  The SARS-CoV-2 RNA is generally detectable in upper respiratory specimens during the acute phase of infection. The lowest concentration of SARS-CoV-2 viral copies this assay can detect is 138 copies/mL. A negative result does not preclude SARS-Cov-2 infection and should not be used as the sole basis for treatment or other patient management decisions. A negative result may occur with  improper specimen collection/handling,  submission of specimen other than nasopharyngeal swab, presence of viral mutation(s) within the areas targeted by this assay, and inadequate number of viral copies(<138 copies/mL). A negative result must be combined with clinical observations, patient history, and epidemiological information. The expected result is Negative.  Fact Sheet for Patients:  bloggercourse.com  Fact Sheet for Healthcare Providers:  seriousbroker.it  This test is no t yet approved or cleared by the [place]  FDA and  has been authorized for detection and/or diagnosis of SARS-CoV-2 by FDA under an Emergency Use Authorization (EUA). This EUA will remain  in  effect (meaning this test can be used) for the duration of the COVID-19 declaration under Section 564(b)(1) of the Act, 21 U.S.C.section 360bbb-3(b)(1), unless the authorization is terminated  or revoked sooner.       Influenza A by PCR NEGATIVE NEGATIVE Final   Influenza B by PCR NEGATIVE NEGATIVE Final    Comment: (NOTE) The Xpert Xpress SARS-CoV-2/FLU/RSV plus assay is intended as an aid in the diagnosis of influenza from Nasopharyngeal swab specimens and should not be used as a sole basis for treatment. Nasal washings and aspirates are unacceptable for Xpert Xpress SARS-CoV-2/FLU/RSV testing.  Fact Sheet for Patients: bloggercourse.com  Fact Sheet for Healthcare Providers: seriousbroker.it  This test is not yet approved or cleared by the [place]  FDA and has been authorized for detection and/or diagnosis of SARS-CoV-2 by FDA under an Emergency Use Authorization (EUA). This EUA will remain in effect (meaning this test can be used) for the duration of the COVID-19 declaration under Section 564(b)(1) of the Act, 21 U.S.C. section 360bbb-3(b)(1), unless the authorization is terminated or revoked.     Resp Syncytial Virus by PCR NEGATIVE  NEGATIVE Final    Comment: (NOTE) Fact Sheet for Patients: bloggercourse.com  Fact Sheet for Healthcare Providers: seriousbroker.it  This test is not yet approved or cleared by the [place]  FDA and has been authorized for detection and/or diagnosis of SARS-CoV-2 by FDA under an Emergency Use Authorization (EUA). This EUA will remain in effect (meaning this test can be used) for the duration of the COVID-19 declaration under Section 564(b)(1) of the Act, 21 U.S.C. section 360bbb-3(b)(1), unless the authorization is terminated or revoked.  Performed at [facility], [address] [place], [place] [address]   MRSA Next Gen by PCR, Nasal     Status: None   Collection Time: 07/13/24 10:53 PM   Specimen: Nasal Mucosa; Nasal Swab  Result Value Ref Range Status   MRSA by PCR Next Gen NOT DETECTED NOT DETECTED Final    Comment: (NOTE) The GeneXpert MRSA Assay (FDA approved for NASAL specimens only), is one component of a comprehensive MRSA colonization surveillance program. It is not intended to diagnose MRSA infection nor to guide or monitor treatment for MRSA infections. Test performance is not FDA approved in patients less than 28 years old. Performed at [facility], [address] [place], [place] [address]   Respiratory (~20 pathogens) panel by PCR     Status: None   Collection Time: 07/14/24 12:09 PM   Specimen: Nasopharyngeal Swab; Respiratory  Result Value Ref Range Status   Adenovirus NOT DETECTED NOT DETECTED Final   Coronavirus 229E NOT DETECTED NOT DETECTED Final    Comment: (NOTE) The Coronavirus on the Respiratory Panel, DOES NOT test for the novel  Coronavirus (2019 nCoV)    Coronavirus HKU1 NOT DETECTED NOT DETECTED Final   Coronavirus NL63 NOT DETECTED NOT DETECTED Final   Coronavirus OC43 NOT DETECTED NOT DETECTED Final   Metapneumovirus NOT DETECTED NOT DETECTED Final   Rhinovirus /  Enterovirus NOT DETECTED NOT DETECTED Final   Influenza A NOT DETECTED NOT DETECTED Final   Influenza B NOT DETECTED NOT DETECTED Final   Parainfluenza Virus 1 NOT DETECTED NOT DETECTED Final   Parainfluenza Virus 2 NOT DETECTED NOT DETECTED Final   Parainfluenza Virus 3 NOT DETECTED NOT DETECTED Final   Parainfluenza Virus 4 NOT DETECTED NOT DETECTED Final   Respiratory Syncytial Virus NOT DETECTED NOT DETECTED Final   Bordetella pertussis NOT DETECTED NOT DETECTED  Final   Bordetella Parapertussis NOT DETECTED NOT DETECTED Final   Chlamydophila pneumoniae NOT DETECTED NOT DETECTED Final   Mycoplasma pneumoniae NOT DETECTED NOT DETECTED Final    Comment: Performed at [facility], [address] [place], [place] [address]  Expectorated Sputum Assessment w Gram Stain, Rflx to Resp Cult     Status: None   Collection Time: 07/15/24  4:00 PM   Specimen: Expectorated Sputum  Result Value Ref Range Status   Specimen Description EXPECTORATED SPUTUM  Final   Special Requests NONE  Final   Sputum evaluation   Final    THIS SPECIMEN IS ACCEPTABLE FOR SPUTUM CULTURE Performed at [facility], [address] [place], [place] [address]    Report Status 07/15/2024 FINAL  Final  Culture, Respiratory w Gram Stain     Status: None   Collection Time: 07/15/24  4:00 PM  Result Value Ref Range Status   Specimen Description   Final    EXPECTORATED SPUTUM Performed at [facility], [address] [place], [place] [address]    Special Requests   Final    NONE Reflexed from [id number] Performed at [facility], [address] [place], [place] [address]    Gram Stain RARE WBC SEEN NO ORGANISMS SEEN   Final   Culture   Final    RARE Normal respiratory flora-no Staph aureus or Pseudomonas seen Performed at [facility], [address] [place], [place] [address]    Report Status 07/18/2024 FINAL  Final     Radiology Studies: No results  found.   Scheduled Meds:  enoxaparin  (LOVENOX ) injection  40 mg Subcutaneous Q24H   feeding supplement  237 mL Oral BID BM   Influenza vac split trivalent PF  0.5 mL Intramuscular Tomorrow-1000   multivitamin with minerals  1 tablet Oral Daily   mouth rinse  15 mL Mouth Rinse 4 times per day   predniSONE   40 mg Oral Q breakfast   sodium chloride  flush  3 mL Intravenous Q12H   Vitamin D  (Ergocalciferol )  50,000 Units Oral Q7 days   Continuous Infusions:     LOS: 6 days   Devaughn KATHEE Ban, MD [facility]  To contact the attending physician between 7A-7P please use Epic Chat. To contact the covering physician during after hours 7P-7A, please review Amion.  07/19/2024, 3:11 PM     "

## 2024-07-19 NOTE — Progress Notes (Deleted)
 Physical Therapy Treatment Patient Details Name: Traci Ballard MRN: [id number] DOB: 10/21/1950 Today's Date: 07/19/2024   History of Present Illness Patient is a 74 year old female with acute respiratory failure with hypoxia and hypercapnia. No known medical history.    PT Comments  Pt reports fatigue but agrees to session.  She is able to get up/down with bed features.  Stands and is motivated to progress gait 12' with RW and min a x 1.  Generally poor walker position with decreased step height and length and mod cues to step up into walker box.  She often lets it go too far in front then when asked to correct steps too far towards bar and needs assist to correct.  Pt uses bathroom prior to opting to return to bed.  Pt stated she is feeling and moving better but not at her baseline.  She remains unsafe to walk unassisted and would benefit from < 3 hrs a day therapy at discharge.   If plan is discharge home, recommend the following: A little help with walking and/or transfers;A little help with bathing/dressing/bathroom;Help with stairs or ramp for entrance;Assist for transportation;Assistance with cooking/housework   Can travel by private vehicle     Yes  Equipment Recommendations  Rolling walker (2 wheels)    Recommendations for Other Services       Precautions / Restrictions Precautions Precautions: Fall Recall of Precautions/Restrictions: Impaired Restrictions Weight Bearing Restrictions Per Provider Order: No     Mobility  Bed Mobility Overal bed mobility: Needs Assistance Bed Mobility: Supine to Sit, Sit to Supine     Supine to sit: Contact guard, HOB elevated, Used rails Sit to supine: Contact guard assist, Used rails, HOB elevated     Patient Response: Cooperative  Transfers Overall transfer level: Needs assistance Equipment used: Rolling walker (2 wheels) Transfers: Sit to/from Stand Sit to Stand: Contact guard assist   Step pivot transfers: Contact  guard assist            Ambulation/Gait Ambulation/Gait assistance: Min assist Gait Distance (Feet): 80 Feet Assistive device: Rolling walker (2 wheels) Gait Pattern/deviations: Decreased stride length, Step-through pattern, Shuffle, Trunk flexed, Decreased step length - left, Decreased stance time - right Gait velocity: dec     General Gait Details: cues to keep close to walker   Stairs             Wheelchair Mobility     Tilt Bed Tilt Bed Patient Response: Cooperative  Modified Rankin (Stroke Patients Only)       Balance Overall balance assessment: Needs assistance Sitting-balance support: No upper extremity supported, Feet supported Sitting balance-Leahy Scale: Good     Standing balance support: Bilateral upper extremity supported, During functional activity Standing balance-Leahy Scale: Fair                              Hotel Manager: No apparent difficulties  Cognition Arousal: Alert Behavior During Therapy: WFL for tasks assessed/performed   PT - Cognitive impairments: No family/caregiver present to determine baseline                         Following commands: Impaired Following commands impaired: Follows one step commands with increased time    Cueing Cueing Techniques: Verbal cues, Tactile cues  Exercises      General Comments        Pertinent Vitals/Pain Pain Assessment Pain  Assessment: No/denies pain    Home Living                          Prior Function            PT Goals (current goals can now be found in the care plan section) Progress towards PT goals: Progressing toward goals    Frequency    Min 2X/week      PT Plan      Co-evaluation              AM-PAC PT 6 Clicks Mobility   Outcome Measure  Help needed turning from your back to your side while in a flat bed without using bedrails?: None Help needed moving from lying on your back to  sitting on the side of a flat bed without using bedrails?: A Little Help needed moving to and from a bed to a chair (including a wheelchair)?: A Little Help needed standing up from a chair using your arms (e.g., wheelchair or bedside chair)?: A Little Help needed to walk in hospital room?: A Little Help needed climbing 3-5 steps with a railing? : A Lot 6 Click Score: 18    End of Session Equipment Utilized During Treatment: Gait belt Activity Tolerance: Patient tolerated treatment well;Patient limited by fatigue Patient left: with call bell/phone within reach;with family/visitor present;in bed;with bed alarm set Nurse Communication: Mobility status PT Visit Diagnosis: Muscle weakness (generalized) (M62.81);Unsteadiness on feet (R26.81)     Time: 8977-8958 PT Time Calculation (min) (ACUTE ONLY): 19 min  Charges:    $Gait Training: 8-22 mins PT General Charges $$ ACUTE PT VISIT: 1 Visit                   Lauraine Gills, PTA 07/19/2024, 12:09 PM

## 2024-07-20 LAB — GLUCOSE, CAPILLARY: Glucose-Capillary: 63 mg/dL — ABNORMAL LOW (ref 70–99)

## 2024-07-20 MED ORDER — BUDESON-GLYCOPYRROL-FORMOTEROL 160-9-4.8 MCG/ACT IN AERO
2.0000 | INHALATION_SPRAY | Freq: Two times a day (BID) | RESPIRATORY_TRACT | Status: DC
  Administered 2024-07-20 – 2024-07-22 (×4): 2 via RESPIRATORY_TRACT
  Filled 2024-07-20: qty 5.9

## 2024-07-20 NOTE — Plan of Care (Signed)

## 2024-07-20 NOTE — Progress Notes (Signed)
" °   07/20/24 1015  Spiritual Encounters  Type of Visit Initial  Care provided to: Pt not available   Chaplain knocked and entered the room and called the pt's name. Pt did not respond to chaplain as she appeared to be asleep.  "

## 2024-07-20 NOTE — Progress Notes (Signed)
 " PROGRESS NOTE    Traci Ballard  FMW:[id number] DOB: 09-06-1950 DOA: 07/13/2024 PCP: Tanda Katz, MD  Chief Complaint  Patient presents with   Shortness of Breath    Hospital Course:  Traci Ballard is a 74 y.o. year old female without known medical history presenting to the ED with worsening shortness of breath.  She states has been having shortness of breath for over a week.  She has been having some coughing with sputum production.  Denies any fever or chills but states her body is having difficulty with thermoregulation.  She states she does not have any medical problems and does not take any medications other than over-the-counter vitamin D   Patient admitted for acute hypoxic and hypercapnic respiratory failure, on BiPAP.  Admitted with suspected COPD exacerbation and acute heart failure.  Hospital course as below  Subjective: Breathing stable, tolerating diet. No complaints    Objective: Vitals:   07/19/24 1532 07/19/24 1951 07/20/24 0435 07/20/24 0442  BP: 118/71 123/64 121/70   Pulse: 83 83 83   Resp: 20 17 17    Temp: 98.6 F (37 C) 98.4 F (36.9 C) 98.4 F (36.9 C)   TempSrc: Oral  Oral   SpO2: 91% 92% 94%   Weight:    47.7 kg  Height:        Intake/Output Summary (Last 24 hours) at 07/20/2024 1301 Last data filed at 07/20/2024 1013 Gross per 24 hour  Intake 620 ml  Output --  Net 620 ml   Filed Weights   07/18/24 0434 07/19/24 0419 07/20/24 0442  Weight: 47.4 kg 48.2 kg 47.7 kg    Examination: Gen: nad CV: rrr no murmur Lung: diminished breath sounds, rhonchorous. Stable. No increased wob Abd: No TTP, normal bowel sounds MSK: No asymmetry, cachetic appearance  Neuro: alert and oriented  Assessment & Plan:  Acute hypoxic and hypercapnic respiratory failure  2/2 copd exacerbation, possible pna. Co2 elevated, appears will qualify for home bipap Toc to assist with outpt bipap Continue Avery o2, stable on 2 liters, will likely d/c on  that  CHF? Tte with mod mr/tr, preserved ef, think copd primary driver of sympotms Stable off lasix   Hypernatremia No aki. Resolved with ivf, now stable off fluids  COPD exacerbation CAP No formal diagnosis but symptoms, ct scan, and smoking history suggestive RVP negative CT PE negative for PE.  Diffuse peribronchovascular nodularity and bronchial wall thickening, consistent with airway inflammation Viral panels neg S/p 5 day course prednisone  Sputum culture normal flora Completed 5 day course ceftriaxone /azithromycin   Start breztri , would plan on discharging with triple therapy Patient refused flu vaccine  Loose stools No diarrhea or abd pain. Patient reports this has happened in the past with prednisone  (now discontinued) - loperamide  prn  Elevated troponin 26 -> 31, suspect due to demand ischemia Trend troponin  Severe vitamin D  deficiency 50,000 units weekly  Diffuse osteopenia with severe anterior wedge compression deformities from T6-T9 and resultant severe kyphotic deformity Denies acute back pain  Debility PT advising snf. Toc consulted, has agreed [facility], [facility] now pending   DVT prophylaxis: Lovenox    Code Status: Full Code Disposition:  snf Family: daughter updated telephonically 1/5  Consultants:  None  Procedures:  None  Antimicrobials:  Anti-infectives (From admission, onward)    Start     Dose/Rate Route Frequency Ordered Stop   07/18/24 0100  cefTRIAXone  (ROCEPHIN ) 1 g in sodium chloride  0.9 % 100 mL IVPB  1 g 200 mL/hr over 30 Minutes Intravenous Every 24 hours 07/17/24 1106 07/18/24 0033   07/16/24 0100  cefTRIAXone  (ROCEPHIN ) 1 g in sodium chloride  0.9 % 100 mL IVPB  Status:  Discontinued        1 g 200 mL/hr over 30 Minutes Intravenous Every 24 hours 07/15/24 1302 07/17/24 1106   07/14/24 0130  azithromycin  (ZITHROMAX ) 500 mg in sodium chloride  0.9 % 250 mL IVPB        500 mg 250 mL/hr over 60 Minutes  Intravenous Every 24 hours 07/14/24 0041 07/17/24 0757   07/14/24 0045  cefTRIAXone  (ROCEPHIN ) 2 g in sodium chloride  0.9 % 100 mL IVPB  Status:  Discontinued        2 g 200 mL/hr over 30 Minutes Intravenous Every 24 hours 07/13/24 2348 07/15/24 1302       Data Reviewed: I have personally reviewed following labs and imaging studies CBC: Recent Labs  Lab 07/13/24 1747 07/14/24 0319 07/15/24 0247  WBC 12.4* 10.4 12.4*  NEUTROABS 9.2*  --   --   HGB 15.7* 13.6 13.4  HCT 47.8* 41.1 42.2  MCV 98.8 98.8 102.2*  PLT 383 305 312   Basic Metabolic Panel: Recent Labs  Lab 07/13/24 2307 07/14/24 0319 07/15/24 0247 07/16/24 0408 07/17/24 0350 07/18/24 0529  NA  --  140 147* 150* 143 136  K  --  4.0 3.9 3.8 3.9 4.2  CL  --  95* 98 100 96* 95*  CO2  --  34* 41* 44* 41* 38*  GLUCOSE  --  137* 118* 151* 113* 95  BUN  --  39* 46* 32* 27* 17  CREATININE  --  0.53 0.60 0.41* 0.38* 0.36*  CALCIUM  --  8.8* 9.2 9.1 8.4* 8.3*  MG 2.4  --  2.7*  --   --   --    GFR: Estimated Creatinine Clearance: 45.8 mL/min (A) (by C-G formula based on SCr of 0.36 mg/dL (L)). Liver Function Tests: Recent Labs  Lab 07/13/24 1747  AST 19  ALT 21  ALKPHOS 128*  BILITOT 0.8  PROT 7.8  ALBUMIN 4.3   CBG: Recent Labs  Lab 07/18/24 1957 07/19/24 0841 07/19/24 1157 07/19/24 2056 07/20/24 0916  GLUCAP 185* 87 121* 152* 63*    Recent Results (from the past 240 hours)  Resp panel by RT-PCR (RSV, Flu A&B, Covid) Anterior Nasal Swab     Status: None   Collection Time: 07/13/24  5:45 PM   Specimen: Anterior Nasal Swab  Result Value Ref Range Status   SARS Coronavirus 2 by RT PCR NEGATIVE NEGATIVE Final    Comment: (NOTE) SARS-CoV-2 target nucleic acids are NOT DETECTED.  The SARS-CoV-2 RNA is generally detectable in upper respiratory specimens during the acute phase of infection. The lowest concentration of SARS-CoV-2 viral copies this assay can detect is 138 copies/mL. A negative result  does not preclude SARS-Cov-2 infection and should not be used as the sole basis for treatment or other patient management decisions. A negative result may occur with  improper specimen collection/handling, submission of specimen other than nasopharyngeal swab, presence of viral mutation(s) within the areas targeted by this assay, and inadequate number of viral copies(<138 copies/mL). A negative result must be combined with clinical observations, patient history, and epidemiological information. The expected result is Negative.  Fact Sheet for Patients:  bloggercourse.com  Fact Sheet for Healthcare Providers:  seriousbroker.it  This test is no t yet approved or cleared by the [place]   FDA and  has been authorized for detection and/or diagnosis of SARS-CoV-2 by FDA under an Emergency Use Authorization (EUA). This EUA will remain  in effect (meaning this test can be used) for the duration of the COVID-19 declaration under Section 564(b)(1) of the Act, 21 U.S.C.section 360bbb-3(b)(1), unless the authorization is terminated  or revoked sooner.       Influenza A by PCR NEGATIVE NEGATIVE Final   Influenza B by PCR NEGATIVE NEGATIVE Final    Comment: (NOTE) The Xpert Xpress SARS-CoV-2/FLU/RSV plus assay is intended as an aid in the diagnosis of influenza from Nasopharyngeal swab specimens and should not be used as a sole basis for treatment. Nasal washings and aspirates are unacceptable for Xpert Xpress SARS-CoV-2/FLU/RSV testing.  Fact Sheet for Patients: bloggercourse.com  Fact Sheet for Healthcare Providers: seriousbroker.it  This test is not yet approved or cleared by the [place]  FDA and has been authorized for detection and/or diagnosis of SARS-CoV-2 by FDA under an Emergency Use Authorization (EUA). This EUA will remain in effect (meaning this test can be used) for  the duration of the COVID-19 declaration under Section 564(b)(1) of the Act, 21 U.S.C. section 360bbb-3(b)(1), unless the authorization is terminated or revoked.     Resp Syncytial Virus by PCR NEGATIVE NEGATIVE Final    Comment: (NOTE) Fact Sheet for Patients: bloggercourse.com  Fact Sheet for Healthcare Providers: seriousbroker.it  This test is not yet approved or cleared by the [place]  FDA and has been authorized for detection and/or diagnosis of SARS-CoV-2 by FDA under an Emergency Use Authorization (EUA). This EUA will remain in effect (meaning this test can be used) for the duration of the COVID-19 declaration under Section 564(b)(1) of the Act, 21 U.S.C. section 360bbb-3(b)(1), unless the authorization is terminated or revoked.  Performed at [facility], [address] [place], [place] [address]   MRSA Next Gen by PCR, Nasal     Status: None   Collection Time: 07/13/24 10:53 PM   Specimen: Nasal Mucosa; Nasal Swab  Result Value Ref Range Status   MRSA by PCR Next Gen NOT DETECTED NOT DETECTED Final    Comment: (NOTE) The GeneXpert MRSA Assay (FDA approved for NASAL specimens only), is one component of a comprehensive MRSA colonization surveillance program. It is not intended to diagnose MRSA infection nor to guide or monitor treatment for MRSA infections. Test performance is not FDA approved in patients less than 61 years old. Performed at [facility], [address] [place], [place] [address]   Respiratory (~20 pathogens) panel by PCR     Status: None   Collection Time: 07/14/24 12:09 PM   Specimen: Nasopharyngeal Swab; Respiratory  Result Value Ref Range Status   Adenovirus NOT DETECTED NOT DETECTED Final   Coronavirus 229E NOT DETECTED NOT DETECTED Final    Comment: (NOTE) The Coronavirus on the Respiratory Panel, DOES NOT test for the novel  Coronavirus (2019 nCoV)    Coronavirus  HKU1 NOT DETECTED NOT DETECTED Final   Coronavirus NL63 NOT DETECTED NOT DETECTED Final   Coronavirus OC43 NOT DETECTED NOT DETECTED Final   Metapneumovirus NOT DETECTED NOT DETECTED Final   Rhinovirus / Enterovirus NOT DETECTED NOT DETECTED Final   Influenza A NOT DETECTED NOT DETECTED Final   Influenza B NOT DETECTED NOT DETECTED Final   Parainfluenza Virus 1 NOT DETECTED NOT DETECTED Final   Parainfluenza Virus 2 NOT DETECTED NOT DETECTED Final   Parainfluenza Virus 3 NOT DETECTED NOT DETECTED Final  Parainfluenza Virus 4 NOT DETECTED NOT DETECTED Final   Respiratory Syncytial Virus NOT DETECTED NOT DETECTED Final   Bordetella pertussis NOT DETECTED NOT DETECTED Final   Bordetella Parapertussis NOT DETECTED NOT DETECTED Final   Chlamydophila pneumoniae NOT DETECTED NOT DETECTED Final   Mycoplasma pneumoniae NOT DETECTED NOT DETECTED Final    Comment: Performed at [facility], [address], [place], [place] [address]  Expectorated Sputum Assessment w Gram Stain, Rflx to Resp Cult     Status: None   Collection Time: 07/15/24  4:00 PM   Specimen: Expectorated Sputum  Result Value Ref Range Status   Specimen Description EXPECTORATED SPUTUM  Final   Special Requests NONE  Final   Sputum evaluation   Final    THIS SPECIMEN IS ACCEPTABLE FOR SPUTUM CULTURE Performed at [facility], [address], [place], [place] [address]    Report Status 07/15/2024 FINAL  Final  Culture, Respiratory w Gram Stain     Status: None   Collection Time: 07/15/24  4:00 PM  Result Value Ref Range Status   Specimen Description   Final    EXPECTORATED SPUTUM Performed at [facility], [address] [place], [place] [address]    Special Requests   Final    NONE Reflexed from [phone] Performed at [facility], [address] [place], [place] [address]    Gram Stain RARE WBC SEEN NO ORGANISMS SEEN   Final   Culture   Final    RARE Normal respiratory flora-no  Staph aureus or Pseudomonas seen Performed at [facility], [address] [place], [place] [address]    Report Status 07/18/2024 FINAL  Final     Radiology Studies: No results found.   Scheduled Meds:  enoxaparin  (LOVENOX ) injection  40 mg Subcutaneous Q24H   feeding supplement  237 mL Oral BID BM   Influenza vac split trivalent PF  0.5 mL Intramuscular Tomorrow-1000   multivitamin with minerals  1 tablet Oral Daily   mouth rinse  15 mL Mouth Rinse 4 times per day   sodium chloride  flush  3 mL Intravenous Q12H   Vitamin D  (Ergocalciferol )  50,000 Units Oral Q7 days   Continuous Infusions:     LOS: 7 days   Devaughn KATHEE Ban, MD [facility]  To contact the attending physician between 7A-7P please use Epic Chat. To contact the covering physician during after hours 7P-7A, please review Amion.  07/20/2024, 1:01 PM     "

## 2024-07-20 NOTE — Progress Notes (Signed)
 Physical Therapy Treatment Patient Details Name: Traci Ballard MRN: [id number] DOB: 15-Jun-1951 Today's Date: 07/20/2024   History of Present Illness Patient is a 74 year old female with acute respiratory failure with hypoxia and hypercapnia. No known medical history.    PT Comments  Pt ready for session.  Inc BM in bed and needing care.  She is able to get to [facility] for bathing, barrier cream and med soft BM.  She is able to walk around bed to chair with RW and cga x 1.  Remains up with feet down for independent AROM.   If plan is discharge home, recommend the following: A little help with walking and/or transfers;A little help with bathing/dressing/bathroom;Help with stairs or ramp for entrance;Assist for transportation;Assistance with cooking/housework   Can travel by private vehicle        Equipment Recommendations       Recommendations for Other Services       Precautions / Restrictions Precautions Precautions: Fall Recall of Precautions/Restrictions: Impaired Restrictions Weight Bearing Restrictions Per Provider Order: No     Mobility  Bed Mobility Overal bed mobility: Needs Assistance Bed Mobility: Supine to Sit     Supine to sit: Contact guard, HOB elevated, Used rails       Patient Response: Cooperative  Transfers Overall transfer level: Needs assistance Equipment used: Rolling walker (2 wheels) Transfers: Sit to/from Stand Sit to Stand: Contact guard assist                Ambulation/Gait Ambulation/Gait assistance: Contact guard assist, Min assist Gait Distance (Feet): 25 Feet Assistive device: Rolling walker (2 wheels) Gait Pattern/deviations: Decreased stride length, Step-through pattern, Shuffle, Trunk flexed, Decreased step length - left, Decreased stance time - right           Stairs             Wheelchair Mobility     Tilt Bed Tilt Bed Patient Response: Cooperative  Modified Rankin (Stroke Patients Only)        Balance Overall balance assessment: Needs assistance Sitting-balance support: No upper extremity supported, Feet supported Sitting balance-Leahy Scale: Good     Standing balance support: Bilateral upper extremity supported, During functional activity Standing balance-Leahy Scale: Fair                              Hotel Manager: No apparent difficulties  Cognition Arousal: Alert Behavior During Therapy: WFL for tasks assessed/performed   PT - Cognitive impairments: No family/caregiver present to determine baseline                         Following commands: Impaired Following commands impaired: Follows one step commands with increased time    Cueing Cueing Techniques: Verbal cues, Tactile cues  Exercises      General Comments        Pertinent Vitals/Pain Pain Assessment Pain Assessment: No/denies pain    Home Living                          Prior Function            PT Goals (current goals can now be found in the care plan section) Progress towards PT goals: Progressing toward goals    Frequency    Min 2X/week      PT Plan      Co-evaluation  AM-PAC PT 6 Clicks Mobility   Outcome Measure  Help needed turning from your back to your side while in a flat bed without using bedrails?: None Help needed moving from lying on your back to sitting on the side of a flat bed without using bedrails?: None Help needed moving to and from a bed to a chair (including a wheelchair)?: A Little Help needed standing up from a chair using your arms (e.g., wheelchair or bedside chair)?: A Little Help needed to walk in hospital room?: A Little Help needed climbing 3-5 steps with a railing? : A Lot 6 Click Score: 19    End of Session Equipment Utilized During Treatment: Gait belt Activity Tolerance: Patient tolerated treatment well;Patient limited by fatigue Patient left: with call bell/phone  within reach;with family/visitor present;in bed;with bed alarm set Nurse Communication: Mobility status PT Visit Diagnosis: Muscle weakness (generalized) (M62.81);Unsteadiness on feet (R26.81)     Time: 8858-8843 PT Time Calculation (min) (ACUTE ONLY): 15 min  Charges:    $Gait Training: 8-22 mins PT General Charges $$ ACUTE PT VISIT: 1 Visit                   Lauraine Gills, PTA 07/20/2024, 1:40 PM

## 2024-07-21 ENCOUNTER — Telehealth (HOSPITAL_COMMUNITY): Payer: Self-pay | Admitting: Pharmacy Technician

## 2024-07-21 ENCOUNTER — Other Ambulatory Visit (HOSPITAL_COMMUNITY): Payer: Self-pay

## 2024-07-21 NOTE — TOC Progression Note (Signed)
 Transition of Care [facility]) - Progression Note    Patient Details  Name: Traci Ballard MRN: [id number] Date of Birth: 11/12/1950  [facility] CM/SW Contact  Alfonso Rummer, LCSW Phone Number: 07/21/2024, 11:50 AM  Clinical Narrative:     KEN DELENA Rummer met with patient in room 230 and spk with pt daughter via phone. Pt and daughter is advised [facility] does not have an available bed. Pt request LCSW A Rummer to speak with daughter regarding choosing another facility. Pt daughter chose [facility]. Bipap setting sent to Darrian with [facility] to order and authorization request to modify was sent to [facility].     Barriers to Discharge: Continued Medical Work up               Expected Discharge Plan and Services       Living arrangements for the past 2 months: Single Family Home                                       Social Drivers of Health (SDOH) Interventions SDOH Screenings   Food Insecurity: Patient Unable To Answer (07/13/2024)  Housing: Unknown (07/13/2024)  Transportation Needs: Patient Unable To Answer (07/13/2024)  Utilities: Patient Unable To Answer (07/13/2024)  Social Connections: Patient Unable To Answer (07/13/2024)  Tobacco Use: Medium Risk (07/14/2024)    Readmission Risk Interventions     No data to display

## 2024-07-21 NOTE — Progress Notes (Signed)
 Nutrition Follow-up  DOCUMENTATION CODES:   Underweight, Severe malnutrition in context of chronic illness  INTERVENTION:   -Continue Ensure Plus High Protein po BID, each supplement provides 350 kcal and 20 grams of protein  -Continue MVI with minerals daily -Continue regular diet (added chopped meat to trays per ease of intake and manager's check as patient reports multiple inaccuracies on trays)  NUTRITION DIAGNOSIS:   Severe Malnutrition related to chronic illness (COPD) as evidenced by moderate fat depletion, severe fat depletion, moderate muscle depletion, severe muscle depletion.  Ongoing  GOAL:   Patient will meet greater than or equal to 90% of their needs  Progressing   MONITOR:   PO intake, Supplement acceptance  REASON FOR ASSESSMENT:   Malnutrition Screening Tool    ASSESSMENT:   74 y/o female with no known medical history who is admitted with suspected CHF and COPD exacerbation.  Reviewed I/O's: +2.2 L x 24 hours and +2.1 L since admission  Spoke with patient, who is sitting in recliner chair at time of visit. Patient eager to engage with RD. She reports that she is making progress, but expresses fear of the unknown when it comes to her health and discharge plans. Per TOC notes, plan for SNF placement at discharge. Emotional support provided.   Observed lunch tray- patient consumed about 50% of meal (100% of jello, 50% of corn and grilled chicken). Patient reports her intake is slowly improving. At baseline, she has never been a big eater and often consumes 3 meals per day. She tries to focus on organic and healthy food. Typical meals consist of a rice cake with cheese, meat, or nut butter. She also likes to snack on nuts. Meal completions documented at 100%.   Patient reports inaccuracies on meal trays- reports that she asked for yellow jello, but was given orange flavor and was given orange sherbet, which she did not ask for (but was listed on the meal  ticket). Patient states most of the meats here have been too tough for her. She states she has tried to add additional sauce to her food, but it is still hard. Given patient's malnutrition, hesitant to downgrade diet further as do not want to further restrict diet given patient's food selectivity. Patient is amenable to chopped meats for ease of intake. Will also add manager's check to meal trays to improve tray accuracy.   Patient is unsure if she has lost weight. No weight history in CareEverywhere to confirm weight changes. Reviewed weights; weight has ranged from 47-48.2 kg within the past 7 days.   Discussed importance of good meal and supplement intake to promote healing. Pt does not think she has been given Ensure supplements. Noted Premier Protein shake that patient daughter brought in; RD explained differences in both supplements and how Ensure would best benefit her for increased nutritional density. RD also discussed how needs are increased in the hospital and at [facility] for recovery. Patient agreeable.   Medications reviewed and include lovenox  and vitamin D .   Labs reviewed: CBGS: 63-185 (inpatient orders for glycemic control are none).    NUTRITION - FOCUSED PHYSICAL EXAM:  Flowsheet Row Most Recent Value  Orbital Region Severe depletion  Upper Arm Region Severe depletion  Thoracic and Lumbar Region Moderate depletion  Buccal Region Moderate depletion  Temple Region Moderate depletion  Clavicle Bone Region Severe depletion  Clavicle and Acromion Bone Region Severe depletion  Scapular Bone Region Severe depletion  Dorsal Hand Moderate depletion  Patellar Region Moderate depletion  Anterior Thigh Region Moderate depletion  Posterior Calf Region Moderate depletion  Edema (RD Assessment) Mild  Hair Reviewed  Eyes Reviewed  Mouth Reviewed  Skin Reviewed  Nails Reviewed    Diet Order:   Diet Order             Diet regular Room service appropriate? Yes; Fluid consistency: Thin   Diet effective now                   EDUCATION NEEDS:   Education needs have been addressed  Skin:  Skin Assessment: Reviewed RN Assessment  Last BM:  07/21/24 (type 6)  Height:   Ht Readings from Last 1 Encounters:  07/13/24 5' 5 (1.651 m)    Weight:   Wt Readings from Last 1 Encounters:  07/21/24 47.9 kg    Ideal Body Weight:  56.8 kg  BMI:  Body mass index is 17.57 kg/m.  Estimated Nutritional Needs:   Kcal:  1700-1900  Protein:  90-105 grams  Fluid:  1.7-1.9 L    Margery ORN, RD, LDN, CDCES Registered Dietitian III Certified Diabetes Care and Education Specialist If unable to reach this RD, please use RD Inpatient group chat on secure chat between hours of 8am-4 pm daily

## 2024-07-21 NOTE — Telephone Encounter (Signed)
 Patient Product/process Development Scientist completed.    The patient is insured through [place]. Patient has Medicare and is not eligible for a copay card, but may be able to apply for patient assistance or Medicare RX Payment Plan (Patient Must reach out to their plan, if eligible for payment plan), if available.    Ran test claim for Breztri  [id number] mcg and the current 30 day co-pay is $40.00.   This test claim was processed through [facility]- copay amounts may vary at other pharmacies due to pharmacy/plan contracts, or as the patient moves through the different stages of their insurance plan.     Reyes Sharps, CPHT Pharmacy Technician Patient Advocate Specialist Lead [facility] Patient Advocate Team Direct Number: [phone]  Fax: [phone]

## 2024-07-21 NOTE — Progress Notes (Signed)
 " PROGRESS NOTE    Traci Ballard  FMW:[id number] DOB: [birth year] DOA: 07/13/2024 PCP: Tanda Katz, MD  Chief Complaint  Patient presents with   Shortness of Breath    Hospital Course:  Traci Ballard is a 74 y.o. year old female without known medical history presenting to the ED with worsening shortness of breath.  She states has been having shortness of breath for over a week.  She has been having some coughing with sputum production.  Denies any fever or chills but states her body is having difficulty with thermoregulation.  She states she does not have any medical problems and does not take any medications other than over-the-counter vitamin D   Patient admitted for acute hypoxic and hypercapnic respiratory failure, on BiPAP.  Admitted with suspected COPD exacerbation and acute heart failure.  Hospital course as below  Subjective: Breathing stable, tolerating diet. No complaints. Tolerated bipap last night.   Objective: Vitals:   07/20/24 1936 07/21/24 0343 07/21/24 0448 07/21/24 0847  BP: 131/72 128/80  102/61  Pulse: 83 67  66  Resp: 18 19  18   Temp: 98.6 F (37 C) 97.6 F (36.4 C)  98 F (36.7 C)  TempSrc:    Oral  SpO2: 98% 98%  98%  Weight:   47.9 kg   Height:        Intake/Output Summary (Last 24 hours) at 07/21/2024 1344 Last data filed at 07/21/2024 1100 Gross per 24 hour  Intake 1760 ml  Output --  Net 1760 ml   Filed Weights   07/19/24 0419 07/20/24 0442 07/21/24 0448  Weight: 48.2 kg 47.7 kg 47.9 kg    Examination: Gen: nad CV: rrr no murmur Lung: diminished breath sounds, rhonchorous. Stable. No increased wob Abd: No TTP, normal bowel sounds MSK: No asymmetry, cachetic appearance  Neuro: alert and oriented  Assessment & Plan:  Acute hypoxic and hypercapnic respiratory failure  2/2 copd exacerbation, possible pna. Co2 elevated, appears will qualify for home bipap Bipap ordered for use at snf Continue [place] o2, stable on ~2 liters, will  d/c on that  CHF? Tte with mod mr/tr, preserved ef, think copd primary driver of sympotms Stable off lasix   Hypernatremia No aki. Resolved with ivf, now stable off fluids  COPD exacerbation CAP No formal diagnosis but symptoms, ct scan, and smoking history suggestive RVP negative CT PE negative for PE.  Diffuse peribronchovascular nodularity and bronchial wall thickening, consistent with airway inflammation Viral panels neg S/p 5 day course prednisone  Sputum culture normal flora Completed 5 day course ceftriaxone /azithromycin   Started breztri , would plan on discharging with triple therapy Patient refused flu vaccine  Loose stools No diarrhea or abd pain. 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 Toc consulted, has agreed to bed, auth approved, can d/c tomorrow   DVT prophylaxis: Lovenox    Code Status: Full Code Disposition:  snf Family: daughter updated telephonically 1/6  Consultants:  None  Procedures:  None  Antimicrobials:  Anti-infectives (From admission, onward)    Start     Dose/Rate Route Frequency Ordered Stop   07/18/24 0100  cefTRIAXone  (ROCEPHIN ) 1 g in sodium chloride  0.9 %  100 mL IVPB        1 g 200 mL/hr over 30 Minutes Intravenous Every 24 hours 07/17/24 1106 07/18/24 0033   07/16/24 0100  cefTRIAXone  (ROCEPHIN ) 1 g in sodium chloride  0.9 % 100 mL IVPB  Status:  Discontinued        1 g 200 mL/hr over 30 Minutes Intravenous Every 24 hours 07/15/24 1302 07/17/24 1106   07/14/24 0130  azithromycin  (ZITHROMAX ) 500 mg in sodium chloride  0.9 % 250 mL IVPB         500 mg 250 mL/hr over 60 Minutes Intravenous Every 24 hours 07/14/24 0041 07/17/24 0757   07/14/24 0045  cefTRIAXone  (ROCEPHIN ) 2 g in sodium chloride  0.9 % 100 mL IVPB  Status:  Discontinued        2 g 200 mL/hr over 30 Minutes Intravenous Every 24 hours 07/13/24 2348 07/15/24 1302       Data Reviewed: I have personally reviewed following labs and imaging studies CBC: Recent Labs  Lab 07/15/24 0247  WBC 12.4*  HGB 13.4  HCT 42.2  MCV 102.2*  PLT 312   Basic Metabolic Panel: Recent Labs  Lab 07/15/24 0247 07/16/24 0408 07/17/24 0350 07/18/24 0529  NA 147* 150* 143 136  K 3.9 3.8 3.9 4.2  CL 98 100 96* 95*  CO2 41* 44* 41* 38*  GLUCOSE 118* 151* 113* 95  BUN 46* 32* 27* 17  CREATININE 0.60 0.41* 0.38* 0.36*  CALCIUM 9.2 9.1 8.4* 8.3*  MG 2.7*  --   --   --    GFR: Estimated Creatinine Clearance: 45.9 mL/min (A) (by C-G formula based on SCr of 0.36 mg/dL (L)). Liver Function Tests: No results for input(s): AST, ALT, ALKPHOS, BILITOT, PROT, ALBUMIN in the last 168 hours.  CBG: Recent Labs  Lab 07/18/24 1957 07/19/24 0841 07/19/24 1157 07/19/24 2056 07/20/24 0916  GLUCAP 185* 87 121* 152* 63*    Recent Results (from the past 240 hours)  Resp panel by RT-PCR (RSV, Flu A&B, Covid) Anterior Nasal Swab     Status: None   Collection Time: 07/13/24  5:45 PM   Specimen: Anterior Nasal Swab  Result Value Ref Range Status   SARS Coronavirus 2 by RT PCR NEGATIVE NEGATIVE Final    Comment: (NOTE) SARS-CoV-2 target nucleic acids are NOT DETECTED.  The SARS-CoV-2 RNA is generally detectable in upper respiratory specimens during the acute phase of infection. The lowest concentration of SARS-CoV-2 viral copies this assay can detect is 138 copies/mL. A negative result does not preclude SARS-Cov-2 infection and should not be used as the sole basis for treatment or other patient management decisions. A negative result may occur with  improper specimen  collection/handling, submission of specimen other than nasopharyngeal swab, presence of viral mutation(s) within the areas targeted by this assay, and inadequate number of viral copies(<138 copies/mL). A negative result must be combined with clinical observations, patient history, and epidemiological information. The expected result is Negative.  Fact Sheet for Patients:  bloggercourse.com  Fact Sheet for Healthcare Providers:  seriousbroker.it  This test is no t yet approved or cleared by the [place]  FDA and  has been authorized for detection and/or diagnosis of SARS-CoV-2 by FDA under an Emergency Use Authorization (EUA). This EUA will remain  in effect (meaning this test can be used) for the duration of the COVID-19 declaration under Section 564(b)(1) of the Act, 21 U.S.C.section 360bbb-3(b)(1), unless the authorization is terminated  or revoked sooner.  Influenza A by PCR NEGATIVE NEGATIVE Final   Influenza B by PCR NEGATIVE NEGATIVE Final    Comment: (NOTE) The Xpert Xpress SARS-CoV-2/FLU/RSV plus assay is intended as an aid in the diagnosis of influenza from Nasopharyngeal swab specimens and should not be used as a sole basis for treatment. Nasal washings and aspirates are unacceptable for Xpert Xpress SARS-CoV-2/FLU/RSV testing.  Fact Sheet for Patients: bloggercourse.com  Fact Sheet for Healthcare Providers: seriousbroker.it  This test is not yet approved or cleared by the [place]  FDA and has been authorized for detection and/or diagnosis of SARS-CoV-2 by FDA under an Emergency Use Authorization (EUA). This EUA will remain in effect (meaning this test can be used) for the duration of the COVID-19 declaration under Section 564(b)(1) of the Act, 21 U.S.C. section 360bbb-3(b)(1), unless the authorization is terminated or revoked.     Resp Syncytial  Virus by PCR NEGATIVE NEGATIVE Final    Comment: (NOTE) Fact Sheet for Patients: bloggercourse.com  Fact Sheet for Healthcare Providers: seriousbroker.it  This test is not yet approved or cleared by the [place]  FDA and has been authorized for detection and/or diagnosis of SARS-CoV-2 by FDA under an Emergency Use Authorization (EUA). This EUA will remain in effect (meaning this test can be used) for the duration of the COVID-19 declaration under Section 564(b)(1) of the Act, 21 U.S.C. section 360bbb-3(b)(1), unless the authorization is terminated or revoked.  Performed at [facility], [address] [place], [place] [address]   MRSA Next Gen by PCR, Nasal     Status: None   Collection Time: 07/13/24 10:53 PM   Specimen: Nasal Mucosa; Nasal Swab  Result Value Ref Range Status   MRSA by PCR Next Gen NOT DETECTED NOT DETECTED Final    Comment: (NOTE) The GeneXpert MRSA Assay (FDA approved for NASAL specimens only), is one component of a comprehensive MRSA colonization surveillance program. It is not intended to diagnose MRSA infection nor to guide or monitor treatment for MRSA infections. Test performance is not FDA approved in patients less than 81 years old. Performed at [facility], [address] [place], [place] [address]   Respiratory (~20 pathogens) panel by PCR     Status: None   Collection Time: 07/14/24 12:09 PM   Specimen: Nasopharyngeal Swab; Respiratory  Result Value Ref Range Status   Adenovirus NOT DETECTED NOT DETECTED Final   Coronavirus 229E NOT DETECTED NOT DETECTED Final    Comment: (NOTE) The Coronavirus on the Respiratory Panel, DOES NOT test for the novel  Coronavirus (2019 nCoV)    Coronavirus HKU1 NOT DETECTED NOT DETECTED Final   Coronavirus NL63 NOT DETECTED NOT DETECTED Final   Coronavirus OC43 NOT DETECTED NOT DETECTED Final   Metapneumovirus NOT DETECTED NOT DETECTED  Final   Rhinovirus / Enterovirus NOT DETECTED NOT DETECTED Final   Influenza A NOT DETECTED NOT DETECTED Final   Influenza B NOT DETECTED NOT DETECTED Final   Parainfluenza Virus 1 NOT DETECTED NOT DETECTED Final   Parainfluenza Virus 2 NOT DETECTED NOT DETECTED Final   Parainfluenza Virus 3 NOT DETECTED NOT DETECTED Final   Parainfluenza Virus 4 NOT DETECTED NOT DETECTED Final   Respiratory Syncytial Virus NOT DETECTED NOT DETECTED Final   Bordetella pertussis NOT DETECTED NOT DETECTED Final   Bordetella Parapertussis NOT DETECTED NOT DETECTED Final   Chlamydophila pneumoniae NOT DETECTED NOT DETECTED Final   Mycoplasma pneumoniae NOT DETECTED NOT DETECTED Final    Comment: Performed at [facility],  [address], [place], [place] [address]  Expectorated Sputum Assessment w Gram Stain, Rflx to Resp Cult     Status: None   Collection Time: 07/15/24  4:00 PM   Specimen: Expectorated Sputum  Result Value Ref Range Status   Specimen Description EXPECTORATED SPUTUM  Final   Special Requests NONE  Final   Sputum evaluation   Final    THIS SPECIMEN IS ACCEPTABLE FOR SPUTUM CULTURE Performed at [facility], [address] [place], [place] [address]    Report Status 07/15/2024 FINAL  Final  Culture, Respiratory w Gram Stain     Status: None   Collection Time: 07/15/24  4:00 PM  Result Value Ref Range Status   Specimen Description   Final    EXPECTORATED SPUTUM Performed at [facility], [address], [place], [place] [address]    Special Requests   Final    NONE Reflexed from [id number] Performed at [facility], [address] [place], [place] [address]    Gram Stain RARE WBC SEEN NO ORGANISMS SEEN   Final   Culture   Final    RARE Normal respiratory flora-no Staph aureus or Pseudomonas seen Performed at [facility], [address], [place], [place] [address]    Report Status 07/18/2024 FINAL  Final     Radiology Studies: No  results found.   Scheduled Meds:  budesonide -glycopyrrolate -formoterol   2 puff Inhalation BID   enoxaparin  (LOVENOX ) injection  40 mg Subcutaneous Q24H   feeding supplement  237 mL Oral BID BM   Influenza vac split trivalent PF  0.5 mL Intramuscular Tomorrow-1000   multivitamin with minerals  1 tablet Oral Daily   mouth rinse  15 mL Mouth Rinse 4 times per day   sodium chloride  flush  3 mL Intravenous Q12H   Vitamin D  (Ergocalciferol )  50,000 Units Oral Q7 days   Continuous Infusions:     LOS: 8 days   Devaughn KATHEE Ban, MD [facility]  To contact the attending physician between 7A-7P please use Epic Chat. To contact the covering physician during after hours 7P-7A, please review Amion.  07/21/2024, 1:44 PM     "

## 2024-07-21 NOTE — Progress Notes (Addendum)
 Mobility Specialist - Progress Note   07/21/24 1026  Mobility  Activity Ambulated with assistance  Level of Assistance Standby assist, set-up cues, supervision of patient - no hands on  Assistive Device Front wheel walker  Distance Ambulated (ft) 16 ft  Activity Response Tolerated well  Mobility visit 1 Mobility  Mobility Specialist Start Time (ACUTE ONLY) 1000  Mobility Specialist Stop Time (ACUTE ONLY) 1015  Mobility Specialist Time Calculation (min) (ACUTE ONLY) 15 min   Pt supine upon entry, utilizing RA--- unsure of duration spend on RA. MS connected Pt to O2. Pt completed bed mob ModI, amb to/from the [facility] and around the bed to the recliner with sup, tolerated well--- slight forward flexed trunk. Pt left seated in the recliner with alarm set and needs within reach. RN notified.   America Silvan Mobility Specialist 07/21/2024 10:41 AM

## 2024-07-22 DIAGNOSIS — E43 Unspecified severe protein-calorie malnutrition: Secondary | ICD-10-CM | POA: Insufficient documentation

## 2024-07-22 LAB — CBC
HCT: 38.6 % (ref 36.0–46.0)
Hemoglobin: 12.8 g/dL (ref 12.0–15.0)
MCH: 32.7 pg (ref 26.0–34.0)
MCHC: 33.2 g/dL (ref 30.0–36.0)
MCV: 98.5 fL (ref 80.0–100.0)
Platelets: 246 K/uL (ref 150–400)
RBC: 3.92 MIL/uL (ref 3.87–5.11)
RDW: 14.1 % (ref 11.5–15.5)
WBC: 7.8 K/uL (ref 4.0–10.5)
nRBC: 0 % (ref 0.0–0.2)

## 2024-07-22 MED ORDER — LOPERAMIDE HCL 2 MG PO CAPS: 2.0000 mg | ORAL_CAPSULE | Freq: Four times a day (QID) | ORAL | Status: AC | PRN

## 2024-07-22 MED ORDER — BUDESON-GLYCOPYRROL-FORMOTEROL 160-9-4.8 MCG/ACT IN AERO: 2.0000 | INHALATION_SPRAY | Freq: Two times a day (BID) | RESPIRATORY_TRACT | Status: AC

## 2024-07-22 MED ORDER — VITAMIN D (ERGOCALCIFEROL) 1.25 MG (50000 UNIT) PO CAPS: 50000.0000 [IU] | ORAL_CAPSULE | ORAL | Status: AC

## 2024-07-22 MED ORDER — IPRATROPIUM-ALBUTEROL 0.5-2.5 (3) MG/3ML IN SOLN: 3.0000 mL | Freq: Four times a day (QID) | RESPIRATORY_TRACT | Status: AC | PRN

## 2024-07-22 NOTE — Discharge Summary (Signed)
 " DISCHARGE SUMMARY    Traci Ballard FMW:[id number] DOB: [birth year] DOA: 07/13/2024  PCP: Tanda Katz, MD  Admit date: 07/13/2024 Discharge date: 07/22/2024   Recommendations for Outpatient Follow-up:  Follow up with PCP in 1-2 weeks to follow up on chronic condition management Pulmonology referral for PFTs  Hospital Course: Traci Ballard is a 74 year old female with no known medical history who presented to the ED with dyspnea which has been progressive for the week prior to arrival.  She was admitted for acute hypoxic and hypercapnic respiratory failure requiring BiPAP.  Respiratory failure thought to be secondary to COPD exacerbation.  Due to persistent hypercapnia patient qualified for home BiPAP which was ordered and arranged at SNF.  Oxygen was gradually weaned to 2 L nasal cannula and she is being discharged on this dose. There was concern of CHF, echocardiogram revealed moderate MR and TR with preserved EF.  She received some Lasix  but was stable without.  COPD likely the primary driver of her respiratory symptoms  Acute hypoxic and hypercapnic respiratory failure - Secondary to COPD exacerbation and possible pneumonia - CO2 persistently elevated - Home BiPAP ordered and arranged - Stable on 2 L [place].  DC with this  Questionable CHF - Echo with moderate MR and TR.  Preserved EF. - Stable off of Lasix   Hyponatremia - Resolved with IV fluids.  COPD exacerbation - No formal diagnosis of COPD but given findings on CT scan and smoking history this is likely. -- No longer smoking, reports she quit some time ago but cannot quantify when this is or how much she was smoking at that time - RVP negative - CT PE negative for PE.  Diffuse peribronchovascular nodularities and bronchial wall thickening consistent with airway inflammation. - Status post 5-day prednisone  course - Sputum culture with normal flora - Started on Breztri .  Will discharge with this -  Patient refuses flu vaccine - Pulmonology referral at DC for PFTs  Community-acquired pneumonia - Status post ceftriaxone  azithromycin  5-day course  Loose stools - No diarrhea or abdominal pain.  Patient reports this has happened previously with steroid therapy.  Prednisone  now finished - As needed loperamide   Elevated troponin - Thought to be secondary to demand ischemia - Troponin 26-31 on repeat --EKG without evidence of STEMI  Severe vitamin D  deficiency - 10.3 on arrival.  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 - Patient discharging directly to SNF today.     Discharge Instructions  Discharge Instructions     Call MD for:  difficulty breathing, headache or visual disturbances   Complete by: As directed    Call MD for:  persistant dizziness or light-headedness   Complete by: As directed    Call MD for:  persistant nausea and vomiting   Complete by: As directed    Call MD for:  severe uncontrolled pain   Complete by: As directed    Call MD for:  temperature >100.4   Complete by: As directed    Diet general   Complete by: As directed    Discharge instructions   Complete by: As directed    Establish with a PCP for hospital follow up and chronic condition monitoring   Increase activity slowly   Complete by: As directed  Pulmonary Visit   Complete by: As directed    Presumed COPD, needs hospital follow up and PFTs   Reason for referral: ILD/Pulmonary Fibrosis (IPF)      Allergies as of 07/22/2024       Reactions   Prednisone  Diarrhea   Sulfa Antibiotics Hives        Medication List     TAKE these medications    budesonide -glycopyrrolate -formoterol  160-9-4.8 MCG/ACT Aero inhaler Commonly known as:  BREZTRI  Inhale 2 puffs into the lungs 2 (two) times daily.   ipratropium-albuterol  0.5-2.5 (3) MG/3ML Soln Commonly known as: DUONEB Take 3 mLs by nebulization every 6 (six) hours as needed.   loperamide  2 MG capsule Commonly known as: IMODIUM  Take 1 capsule (2 mg total) by mouth every 6 (six) hours as needed for diarrhea or loose stools.   Vitamin D  (Ergocalciferol ) 1.25 MG (50000 UNIT) Caps capsule Commonly known as: DRISDOL  Take 1 capsule (50,000 Units total) by mouth every 7 (seven) days for 6 doses. Start taking on: July 28, 2024               Durable Medical Equipment  (From admission, onward)           Start     Ordered   07/21/24 1239  For home use only DME Bipap  Once       Comments: Bipap settings are pressure support 10, Peep-5 RR-12 and O2 40%  Question Answer Comment  Length of Need Lifetime   Bleed in oxygen (LPM) 40   Inspiratory pressure 10   Expiratory pressure 5      07/21/24 1241            Contact information for after-discharge care     Destination     [facility] .   Service: Skilled Nursing Contact information: [address] [place]  [address] [phone]                    Allergies[1]  Consultations:    Procedures/Studies: ECHOCARDIOGRAM COMPLETE Result Date: 07/14/2024    ECHOCARDIOGRAM REPORT   Patient Name:   Traci Ballard Date of Exam: 07/14/2024 Medical Rec #:  [id number]             Height:       65.0 in Accession #:    [id number]            Weight:       103.6 lb Date of Birth:  02/03/1951             BSA:          1.496 m Patient Age:    73 years              BP:           116/61 mmHg Patient Gender: F                     HR:           120 bpm. Exam Location:  [facility] Procedure: 2D Echo, Cardiac Doppler and Color Doppler (Both Spectral and Color            Flow Doppler were utilized during procedure). Indications:     Dyspnea R06.00  History:          Patient has no prior history of Echocardiogram examinations.  Signs/Symptoms:Dyspnea.  Sonographer:     Ashley McNeely-Sloane Referring Phys:  [id number] [facility] Diagnosing Phys: Lonni Hanson MD IMPRESSIONS  1. Left ventricular ejection fraction, by estimation, is 65 to 70%. The left ventricle has normal function. The left ventricle has no regional wall motion abnormalities. There is mild left ventricular hypertrophy. Left ventricular diastolic parameters are indeterminate.  2. Right ventricular systolic function is normal. The right ventricular size is normal.  3. The mitral valve is degenerative. Mild to moderate mitral valve regurgitation.  4. Tricuspid valve regurgitation is moderate.  5. The aortic valve is tricuspid. Aortic valve regurgitation is not visualized. No aortic stenosis is present. FINDINGS  Left Ventricle: Left ventricular ejection fraction, by estimation, is 65 to 70%. The left ventricle has normal function. The left ventricle has no regional wall motion abnormalities. The left ventricular internal cavity size was normal in size. There is  mild left ventricular hypertrophy. Left ventricular diastolic parameters are indeterminate. Right Ventricle: The right ventricular size is normal. No increase in right ventricular wall thickness. Right ventricular systolic function is normal. Left Atrium: Left atrial size was normal in size. Right Atrium: Right atrial size was normal in size. Pericardium: The pericardium was not well visualized. Mitral Valve: The mitral valve is degenerative in appearance. There is mild calcification of the mitral valve leaflet(s). Mild to moderate mitral annular calcification. Mild to moderate mitral valve regurgitation. Tricuspid Valve: The tricuspid valve is grossly normal. Tricuspid valve regurgitation is moderate. Aortic Valve: The aortic valve is tricuspid. Aortic valve regurgitation is not visualized. No aortic stenosis is present. Aortic valve mean  gradient measures 6.0 mmHg. Aortic valve peak gradient measures 10.9 mmHg. Aortic valve area, by VTI measures 2.70  cm. Pulmonic Valve: The pulmonic valve was not well visualized. Pulmonic valve regurgitation is mild. No evidence of pulmonic stenosis. Aorta: The aortic root and ascending aorta are structurally normal, with no evidence of dilitation. Pulmonary Artery: The pulmonary artery is not well seen. Venous: The inferior vena cava was not well visualized. IAS/Shunts: The interatrial septum was not well visualized.  LEFT VENTRICLE PLAX 2D LVIDd:         3.60 cm     Diastology LVIDs:         2.50 cm     LV e' medial:    8.95 cm/s LV PW:         1.10 cm     LV E/e' medial:  11.1 LV IVS:        1.10 cm     LV e' lateral:   7.65 cm/s LVOT diam:     2.10 cm     LV E/e' lateral: 12.9 LV SV:         63 LV SV Index:   42 LVOT Area:     3.46 cm LV IVRT:       102 msec  LV Volumes (MOD) LV vol d, MOD A2C: 54.4 ml LV vol d, MOD A4C: 58.2 ml LV vol s, MOD A2C: 13.1 ml LV vol s, MOD A4C: 16.1 ml LV SV MOD A2C:     41.3 ml LV SV MOD A4C:     58.2 ml LV SV MOD BP:      44.1 ml RIGHT VENTRICLE RV Basal diam:  3.80 cm RV Mid diam:    3.40 cm RV S prime:     18.80 cm/s TAPSE (M-mode): 2.9 cm LEFT ATRIUM           Index  RIGHT ATRIUM           Index LA diam:      2.70 cm 1.81 cm/m   RA Area:     13.40 cm LA Vol (A2C): 46.3 ml 30.95 ml/m  RA Volume:   34.70 ml  23.20 ml/m LA Vol (A4C): 20.9 ml 13.97 ml/m  AORTIC VALVE                     PULMONIC VALVE AV Area (Vmax):    2.83 cm      PV Vmax:        0.91 m/s AV Area (Vmean):   2.62 cm      PV Vmean:       62.600 cm/s AV Area (VTI):     2.70 cm      PV VTI:         0.129 m AV Vmax:           165.00 cm/s   PV Peak grad:   3.3 mmHg AV Vmean:          114.000 cm/s  PV Mean grad:   2.0 mmHg AV VTI:            0.232 m       RVOT Peak grad: 1 mmHg AV Peak Grad:      10.9 mmHg AV Mean Grad:      6.0 mmHg LVOT Vmax:         135.00 cm/s LVOT Vmean:        86.200 cm/s LVOT  VTI:          0.181 m LVOT/AV VTI ratio: 0.78  AORTA Ao Root diam: 2.80 cm Ao Asc diam:  2.60 cm MITRAL VALVE                TRICUSPID VALVE MV Area (PHT): 5.66 cm     TV Peak grad:   10.0 mmHg MV Decel Time: 134 msec     TV Mean grad:   6.0 mmHg MV E velocity: 99.00 cm/s   TV Vmax:        1.58 m/s MV A velocity: 127.00 cm/s  TV Vmean:       122.0 cm/s MV E/A ratio:  0.78         TV VTI:         0.25 msec                             TR Peak grad:   30.9 mmHg                             TR Mean grad:   26.0 mmHg                             TR Vmax:        278.00 cm/s                             TR Vmean:       254.0 cm/s                              SHUNTS  Systemic VTI:  0.18 m                             Systemic Diam: 2.10 cm                             Pulmonic VTI:  0.067 m Lonni Hanson MD Electronically signed by Lonni Hanson MD Signature Date/Time: 07/14/2024/7:01:01 PM    Final    CT Angio Chest PE W/Cm &/Or Wo Cm Result Date: 07/14/2024 EXAM: CTA CHEST 07/13/2024 09:08:56 PM TECHNIQUE: CTA of the chest was performed after the administration of intravenous contrast. Multiplanar reformatted images are provided for review. MIP images are provided for review. Automated exposure control, iterative reconstruction, and/or weight based adjustment of the mA/kV was utilized to reduce the radiation dose to as low as reasonably achievable. COMPARISON: None available. CLINICAL HISTORY: Pulmonary embolism, high probability. Dyspnea. FINDINGS: PULMONARY ARTERIES: Pulmonary arteries are suboptimally opacified. The central pulmonary arteries are of normal caliber. Evaluation is adequate only for exclusion of central pulmonary arterial emboli. The segmental and subsegmental pulmonary arteries are not well assessed on this examination, and peripheral pulmonary embolism cannot be excluded. MEDIASTINUM: Extensive coronary artery calcifications. Global cardiac size within normal limits. No  pericardial effusion. Mild atherosclerotic calcification within the thoracic aorta. No aortic aneurysm. No aortic dissection, intramural hematoma, aortic ulceration, or rupture. LYMPH NODES: No mediastinal, hilar or axillary lymphadenopathy. LUNGS AND PLEURA: There is diffuse peribronchovascular nodularity throughout the lungs demonstrating a mid and lower lung zone predominance. Associated bronchial wall thickening in keeping with airway inflammation. In the acute setting, this is most commonly seen with atypical infection including viral pneumonia. If chronic, chronic atypical mycobacterial or fungal infection or chronic hypersensitivity pneumonitis could result in this appearance. Right basilar subsegmental consolidation. No central obstructing lesion. The lungs are symmetrically hyperinflated in keeping with changes of underlying COPD. No pulmonary edema. No evidence of pleural effusion or pneumothorax. UPPER ABDOMEN: Limited images of the upper abdomen are unremarkable. SOFT TISSUES AND BONES: The osseous structures are diffusely osteopenic with severe anterior wedge compression deformities of T6 - T9 with resultant severe kyphotic deformity. No retropulsion. No definite acute bone abnormality identified. No acute soft tissue abnormality. IMPRESSION: 1. No central pulmonary embolism. Limited evaluation of segmental and subsegmental pulmonary emboli. 2. Diffuse peribronchovascular nodularity with mid and lower lung zone predominance and associated bronchial wall thickening, consistent with airway inflammation. Differential includes atypical infection in the acute setting or chronic infection or hypersensitivity pneumonitis if chronic. 3. Right basilar subsegmental consolidation. 4. Symmetrically hyperinflated lungs, consistent with underlying COPD. 5. Diffuse osteopenia with severe anterior wedge compression deformities from T6-T9 and resultant severe kyphotic deformity, without retropulsion. 6. Extensive  coronary artery calcifications. 7. Mild atherosclerotic calcification of the thoracic aorta. Electronically signed by: Dorethia Molt MD 07/14/2024 12:44 AM EST RP Workstation: [id number]   DG Chest Port 1 View Result Date: 07/13/2024 EXAM: 1 VIEW(S) XRAY OF THE CHEST 07/13/2024 06:00:00 PM COMPARISON: None available. CLINICAL HISTORY: shob, hypoxia FINDINGS: LUNGS AND PLEURA: Diffuse reticulonodular interstitial infiltrate, more purulent within the upper lung zones bilaterally, is new since prior examination and can be seen in the setting of atypical infection in the appropriate clinical setting. Trace bilateral pleural effusions are present. No pneumothorax. HEART AND MEDIASTINUM: No acute abnormality of the cardiac and mediastinal silhouettes. BONES AND SOFT TISSUES: Diffuse osteopenia. IMPRESSION: 1. Diffuse reticulonodular interstitial infiltrate with upper  lung zone predominance bilaterally, possibly related to atypical infection. 2. Trace bilateral pleural effusions. 3. Diffuse osteopenia. Electronically signed by: Dorethia Molt MD 07/13/2024 08:25 PM EST RP Workstation: [id number]      Discharge Exam: Vitals:   07/22/24 0348 07/22/24 0816  BP: (!) 124/56 124/60  Pulse: 63 72  Resp: 17 18  Temp: 98.1 F (36.7 C) 98.1 F (36.7 C)  SpO2: 95% 92%   Vitals:   07/21/24 2056 07/22/24 0348 07/22/24 0458 07/22/24 0816  BP: 106/64 (!) 124/56  124/60  Pulse: 84 63  72  Resp: 16 17  18   Temp: 98.3 F (36.8 C) 98.1 F (36.7 C)  98.1 F (36.7 C)  TempSrc:      SpO2: 91% 95%  92%  Weight:   48.6 kg   Height:        Constitutional:  Normal appearance. Non toxic-appearing.  HENT: Head Normocephalic and atraumatic.  Mucous membranes are moist.  Eyes:  Extraocular intact. Conjunctivae normal.  Cardiovascular: Rate and Rhythm: Normal rate and regular rhythm.  Pulmonary: Non labored, symmetric rise of chest wall. On 2L.  Skin: warm and dry. not jaundiced.  Neurological: No focal deficit  present. alert. Oriented.  Psychiatric: Mood and Affect congruent.    The results of significant diagnostics from this hospitalization (including imaging, microbiology, ancillary and laboratory) are listed below for reference.     Microbiology: Recent Results (from the past 240 hours)  Resp panel by RT-PCR (RSV, Flu A&B, Covid) Anterior Nasal Swab     Status: None   Collection Time: 07/13/24  5:45 PM   Specimen: Anterior Nasal Swab  Result Value Ref Range Status   SARS Coronavirus 2 by RT PCR NEGATIVE NEGATIVE Final    Comment: (NOTE) SARS-CoV-2 target nucleic acids are NOT DETECTED.  The SARS-CoV-2 RNA is generally detectable in upper respiratory specimens during the acute phase of infection. The lowest concentration of SARS-CoV-2 viral copies this assay can detect is 138 copies/mL. A negative result does not preclude SARS-Cov-2 infection and should not be used as the sole basis for treatment or other patient management decisions. A negative result may occur with  improper specimen collection/handling, submission of specimen other than nasopharyngeal swab, presence of viral mutation(s) within the areas targeted by this assay, and inadequate number of viral copies(<138 copies/mL). A negative result must be combined with clinical observations, patient history, and epidemiological information. The expected result is Negative.  Fact Sheet for Patients:  bloggercourse.com  Fact Sheet for Healthcare Providers:  seriousbroker.it  This test is no t yet approved or cleared by the [place]  FDA and  has been authorized for detection and/or diagnosis of SARS-CoV-2 by FDA under an Emergency Use Authorization (EUA). This EUA will remain  in effect (meaning this test can be used) for the duration of the COVID-19 declaration under Section 564(b)(1) of the Act, 21 U.S.C.section 360bbb-3(b)(1), unless the authorization is terminated  or  revoked sooner.       Influenza A by PCR NEGATIVE NEGATIVE Final   Influenza B by PCR NEGATIVE NEGATIVE Final    Comment: (NOTE) The Xpert Xpress SARS-CoV-2/FLU/RSV plus assay is intended as an aid in the diagnosis of influenza from Nasopharyngeal swab specimens and should not be used as a sole basis for treatment. Nasal washings and aspirates are unacceptable for Xpert Xpress SARS-CoV-2/FLU/RSV testing.  Fact Sheet for Patients: bloggercourse.com  Fact Sheet for Healthcare Providers: seriousbroker.it  This test is not yet approved or cleared by the [place]   FDA and has been authorized for detection and/or diagnosis of SARS-CoV-2 by FDA under an Emergency Use Authorization (EUA). This EUA will remain in effect (meaning this test can be used) for the duration of the COVID-19 declaration under Section 564(b)(1) of the Act, 21 U.S.C. section 360bbb-3(b)(1), unless the authorization is terminated or revoked.     Resp Syncytial Virus by PCR NEGATIVE NEGATIVE Final    Comment: (NOTE) Fact Sheet for Patients: bloggercourse.com  Fact Sheet for Healthcare Providers: seriousbroker.it  This test is not yet approved or cleared by the [place]  FDA and has been authorized for detection and/or diagnosis of SARS-CoV-2 by FDA under an Emergency Use Authorization (EUA). This EUA will remain in effect (meaning this test can be used) for the duration of the COVID-19 declaration under Section 564(b)(1) of the Act, 21 U.S.C. section 360bbb-3(b)(1), unless the authorization is terminated or revoked.  Performed at [facility], [address] [place], [place] [address]   MRSA Next Gen by PCR, Nasal     Status: None   Collection Time: 07/13/24 10:53 PM   Specimen: Nasal Mucosa; Nasal Swab  Result Value Ref Range Status   MRSA by PCR Next Gen NOT DETECTED NOT DETECTED Final     Comment: (NOTE) The GeneXpert MRSA Assay (FDA approved for NASAL specimens only), is one component of a comprehensive MRSA colonization surveillance program. It is not intended to diagnose MRSA infection nor to guide or monitor treatment for MRSA infections. Test performance is not FDA approved in patients less than 37 years old. Performed at [facility], [address] [place], [place] [address]   Respiratory (~20 pathogens) panel by PCR     Status: None   Collection Time: 07/14/24 12:09 PM   Specimen: Nasopharyngeal Swab; Respiratory  Result Value Ref Range Status   Adenovirus NOT DETECTED NOT DETECTED Final   Coronavirus 229E NOT DETECTED NOT DETECTED Final    Comment: (NOTE) The Coronavirus on the Respiratory Panel, DOES NOT test for the novel  Coronavirus (2019 nCoV)    Coronavirus HKU1 NOT DETECTED NOT DETECTED Final   Coronavirus NL63 NOT DETECTED NOT DETECTED Final   Coronavirus OC43 NOT DETECTED NOT DETECTED Final   Metapneumovirus NOT DETECTED NOT DETECTED Final   Rhinovirus / Enterovirus NOT DETECTED NOT DETECTED Final   Influenza A NOT DETECTED NOT DETECTED Final   Influenza B NOT DETECTED NOT DETECTED Final   Parainfluenza Virus 1 NOT DETECTED NOT DETECTED Final   Parainfluenza Virus 2 NOT DETECTED NOT DETECTED Final   Parainfluenza Virus 3 NOT DETECTED NOT DETECTED Final   Parainfluenza Virus 4 NOT DETECTED NOT DETECTED Final   Respiratory Syncytial Virus NOT DETECTED NOT DETECTED Final   Bordetella pertussis NOT DETECTED NOT DETECTED Final   Bordetella Parapertussis NOT DETECTED NOT DETECTED Final   Chlamydophila pneumoniae NOT DETECTED NOT DETECTED Final   Mycoplasma pneumoniae NOT DETECTED NOT DETECTED Final    Comment: Performed at [facility], [address], [place], [place] [address]  Expectorated Sputum Assessment w Gram Stain, Rflx to Resp Cult     Status: None   Collection Time: 07/15/24  4:00 PM   Specimen: Expectorated Sputum   Result Value Ref Range Status   Specimen Description EXPECTORATED SPUTUM  Final   Special Requests NONE  Final   Sputum evaluation   Final    THIS SPECIMEN IS ACCEPTABLE FOR SPUTUM CULTURE Performed at [facility], [address] [place], [place] [address]  Report Status 07/15/2024 FINAL  Final  Culture, Respiratory w Gram Stain     Status: None   Collection Time: 07/15/24  4:00 PM  Result Value Ref Range Status   Specimen Description   Final    EXPECTORATED SPUTUM Performed at [facility], [address] [place], [place] [address]    Special Requests   Final    NONE Reflexed from [id number] Performed at [facility], [address] [place], [place] [address]    Gram Stain RARE WBC SEEN NO ORGANISMS SEEN   Final   Culture   Final    RARE Normal respiratory flora-no Staph aureus or Pseudomonas seen Performed at [facility], [address], [place], [place] [address]    Report Status 07/18/2024 FINAL  Final     Labs: BNP (last 3 results) No results for input(s): BNP in the last 8760 hours. Basic Metabolic Panel: Recent Labs  Lab 07/16/24 0408 07/17/24 0350 07/18/24 0529  NA 150* 143 136  K 3.8 3.9 4.2  CL 100 96* 95*  CO2 44* 41* 38*  GLUCOSE 151* 113* 95  BUN 32* 27* 17  CREATININE 0.41* 0.38* 0.36*  CALCIUM 9.1 8.4* 8.3*   Liver Function Tests: No results for input(s): AST, ALT, ALKPHOS, BILITOT, PROT, ALBUMIN in the last 168 hours. No results for input(s): LIPASE, AMYLASE in the last 168 hours. No results for input(s): AMMONIA in the last 168 hours. CBC: Recent Labs  Lab 07/22/24 0452  WBC 7.8  HGB 12.8  HCT 38.6  MCV 98.5  PLT 246   Cardiac Enzymes: No results for input(s): CKTOTAL, CKMB, CKMBINDEX, TROPONINI in the last 168 hours. BNP: Invalid input(s): POCBNP CBG: Recent Labs  Lab 07/18/24 1957 07/19/24 0841 07/19/24 1157 07/19/24 2056 07/20/24 0916  GLUCAP 185* 87 121*  152* 63*   D-Dimer No results for input(s): DDIMER in the last 72 hours. Hgb A1c No results for input(s): HGBA1C in the last 72 hours. Lipid Profile No results for input(s): CHOL, HDL, LDLCALC, TRIG, CHOLHDL, LDLDIRECT in the last 72 hours. Thyroid function studies No results for input(s): TSH, T4TOTAL, T3FREE, THYROIDAB in the last 72 hours.  Invalid input(s): FREET3 Anemia work up No results for input(s): VITAMINB12, FOLATE, FERRITIN, TIBC, IRON, RETICCTPCT in the last 72 hours. Urinalysis    Component Value Date/Time   COLORURINE YELLOW 03/19/2014 0411   APPEARANCEUR CLEAR 03/19/2014 0411   LABSPEC 1.015 03/19/2014 0411   PHURINE 5.5 03/19/2014 0411   GLUCOSEU NEGATIVE 03/19/2014 0411   HGBUR TRACE (A) 03/19/2014 0411   BILIRUBINUR NEGATIVE 03/19/2014 0411   KETONESUR NEGATIVE 03/19/2014 0411   PROTEINUR NEGATIVE 03/19/2014 0411   UROBILINOGEN 0.2 03/19/2014 0411   NITRITE NEGATIVE 03/19/2014 0411   LEUKOCYTESUR SMALL (A) 03/19/2014 0411   Sepsis Labs Recent Labs  Lab 07/22/24 0452  WBC 7.8   Microbiology Recent Results (from the past 240 hours)  Resp panel by RT-PCR (RSV, Flu A&B, Covid) Anterior Nasal Swab     Status: None   Collection Time: 07/13/24  5:45 PM   Specimen: Anterior Nasal Swab  Result Value Ref Range Status   SARS Coronavirus 2 by RT PCR NEGATIVE NEGATIVE Final    Comment: (NOTE) SARS-CoV-2 target nucleic acids are NOT DETECTED.  The SARS-CoV-2 RNA is generally detectable in upper respiratory specimens during the acute phase of infection. The lowest concentration of SARS-CoV-2 viral copies this assay can detect is 138 copies/mL. A negative result does not preclude SARS-Cov-2 infection  and should not be used as the sole basis for treatment or other patient management decisions. A negative result may occur with  improper specimen collection/handling, submission of specimen other than nasopharyngeal swab,  presence of viral mutation(s) within the areas targeted by this assay, and inadequate number of viral copies(<138 copies/mL). A negative result must be combined with clinical observations, patient history, and epidemiological information. The expected result is Negative.  Fact Sheet for Patients:  bloggercourse.com  Fact Sheet for Healthcare Providers:  seriousbroker.it  This test is no t yet approved or cleared by the [place]  FDA and  has been authorized for detection and/or diagnosis of SARS-CoV-2 by FDA under an Emergency Use Authorization (EUA). This EUA will remain  in effect (meaning this test can be used) for the duration of the COVID-19 declaration under Section 564(b)(1) of the Act, 21 U.S.C.section 360bbb-3(b)(1), unless the authorization is terminated  or revoked sooner.       Influenza A by PCR NEGATIVE NEGATIVE Final   Influenza B by PCR NEGATIVE NEGATIVE Final    Comment: (NOTE) The Xpert Xpress SARS-CoV-2/FLU/RSV plus assay is intended as an aid in the diagnosis of influenza from Nasopharyngeal swab specimens and should not be used as a sole basis for treatment. Nasal washings and aspirates are unacceptable for Xpert Xpress SARS-CoV-2/FLU/RSV testing.  Fact Sheet for Patients: bloggercourse.com  Fact Sheet for Healthcare Providers: seriousbroker.it  This test is not yet approved or cleared by the [place]  FDA and has been authorized for detection and/or diagnosis of SARS-CoV-2 by FDA under an Emergency Use Authorization (EUA). This EUA will remain in effect (meaning this test can be used) for the duration of the COVID-19 declaration under Section 564(b)(1) of the Act, 21 U.S.C. section 360bbb-3(b)(1), unless the authorization is terminated or revoked.     Resp Syncytial Virus by PCR NEGATIVE NEGATIVE Final    Comment: (NOTE) Fact Sheet for  Patients: bloggercourse.com  Fact Sheet for Healthcare Providers: seriousbroker.it  This test is not yet approved or cleared by the [place]  FDA and has been authorized for detection and/or diagnosis of SARS-CoV-2 by FDA under an Emergency Use Authorization (EUA). This EUA will remain in effect (meaning this test can be used) for the duration of the COVID-19 declaration under Section 564(b)(1) of the Act, 21 U.S.C. section 360bbb-3(b)(1), unless the authorization is terminated or revoked.  Performed at [facility], [address] [place], [place] [address]   MRSA Next Gen by PCR, Nasal     Status: None   Collection Time: 07/13/24 10:53 PM   Specimen: Nasal Mucosa; Nasal Swab  Result Value Ref Range Status   MRSA by PCR Next Gen NOT DETECTED NOT DETECTED Final    Comment: (NOTE) The GeneXpert MRSA Assay (FDA approved for NASAL specimens only), is one component of a comprehensive MRSA colonization surveillance program. It is not intended to diagnose MRSA infection nor to guide or monitor treatment for MRSA infections. Test performance is not FDA approved in patients less than 71 years old. Performed at [facility], [address] [place], [place] [address]   Respiratory (~20 pathogens) panel by PCR     Status: None   Collection Time: 07/14/24 12:09 PM   Specimen: Nasopharyngeal Swab; Respiratory  Result Value Ref Range Status   Adenovirus NOT DETECTED NOT DETECTED Final   Coronavirus 229E NOT DETECTED NOT DETECTED Final    Comment: (NOTE) The Coronavirus on the Respiratory Panel, DOES NOT test for the novel  Coronavirus (2019 nCoV)    Coronavirus HKU1 NOT DETECTED NOT DETECTED Final   Coronavirus NL63 NOT DETECTED NOT DETECTED Final   Coronavirus OC43 NOT DETECTED NOT DETECTED Final   Metapneumovirus NOT DETECTED NOT DETECTED Final   Rhinovirus / Enterovirus NOT DETECTED NOT DETECTED Final    Influenza A NOT DETECTED NOT DETECTED Final   Influenza B NOT DETECTED NOT DETECTED Final   Parainfluenza Virus 1 NOT DETECTED NOT DETECTED Final   Parainfluenza Virus 2 NOT DETECTED NOT DETECTED Final   Parainfluenza Virus 3 NOT DETECTED NOT DETECTED Final   Parainfluenza Virus 4 NOT DETECTED NOT DETECTED Final   Respiratory Syncytial Virus NOT DETECTED NOT DETECTED Final   Bordetella pertussis NOT DETECTED NOT DETECTED Final   Bordetella Parapertussis NOT DETECTED NOT DETECTED Final   Chlamydophila pneumoniae NOT DETECTED NOT DETECTED Final   Mycoplasma pneumoniae NOT DETECTED NOT DETECTED Final    Comment: Performed at [facility], [address], [place], [place] [address]  Expectorated Sputum Assessment w Gram Stain, Rflx to Resp Cult     Status: None   Collection Time: 07/15/24  4:00 PM   Specimen: Expectorated Sputum  Result Value Ref Range Status   Specimen Description EXPECTORATED SPUTUM  Final   Special Requests NONE  Final   Sputum evaluation   Final    THIS SPECIMEN IS ACCEPTABLE FOR SPUTUM CULTURE Performed at [facility], [address], [place], [place] [address]    Report Status 07/15/2024 FINAL  Final  Culture, Respiratory w Gram Stain     Status: None   Collection Time: 07/15/24  4:00 PM  Result Value Ref Range Status   Specimen Description   Final    EXPECTORATED SPUTUM Performed at [facility], [address], [place], [place] [address]    Special Requests   Final    NONE Reflexed from [phone] Performed at [facility], [address] [place], [place] [address]    Gram Stain RARE WBC SEEN NO ORGANISMS SEEN   Final   Culture   Final    RARE Normal respiratory flora-no Staph aureus or Pseudomonas seen Performed at [facility], [address] [place], [place] [address]    Report Status 07/18/2024 FINAL  Final     Time coordinating discharge: 32 min   SIGNED: Adlynn Lowenstein, DO [facility] 07/22/2024, 10:48 AM Pager   If 7PM-7AM, please contact night-coverage     [1]  Allergies Allergen Reactions   Prednisone  Diarrhea   Sulfa Antibiotics Hives   "

## 2024-07-22 NOTE — TOC Transition Note (Signed)
 Transition of Care [facility]) - Discharge Note   Patient Details  Name: Traci Ballard MRN: [id number] Date of Birth: [birth year]  [facility] CM/SW Contact:  Alfonso Rummer, LCSW Phone Number: 07/22/2024, 11:14 AM   Clinical Narrative:     Pt will transition to [facility]. LCSW [facility] sent patient daughter text message via cell phone at her request due to pt daughter is a engineer, site. Pt is also informed to discharge. Darrian at [facility] [employer] confirmed bipap is delivered. No further toc needs.   Final next level of care: Skilled Nursing Facility Barriers to Discharge: Barriers Resolved   Patient Goals and CMS Choice   CMS Medicare.gov Compare Post Acute Care list provided to:: Patient Choice offered to / list presented to : Patient, Adult Children (spk with daughter via phone regarding options)      Discharge Placement PASRR number recieved: 07/22/24 Existing PASRR number confirmed : 07/22/24          Patient chooses bed at: [facility] Patient to be transferred to facility by: [facility] Name of family member notified: pt daughter via phone Patient and family notified of of transfer: 07/22/24  Discharge Plan and Services Additional resources added to the After Visit Summary for                                       Social Drivers of Health (SDOH) Interventions SDOH Screenings   Food Insecurity: Patient Unable To Answer (07/13/2024)  Housing: Unknown (07/13/2024)  Transportation Needs: Patient Unable To Answer (07/13/2024)  Utilities: Patient Unable To Answer (07/13/2024)  Social Connections: Patient Unable To Answer (07/13/2024)  Tobacco Use: Medium Risk (07/14/2024)     Readmission Risk Interventions     No data to display

## 2024-07-22 NOTE — Hospital Course (Addendum)
 Traci Ballard is a 74 year old female with no known medical history who presented to the ED with dyspnea which has been progressive for the week prior to arrival.  She was admitted for acute hypoxic and hypercapnic respiratory failure requiring BiPAP.  Respiratory failure thought to be secondary to COPD exacerbation.  Due to persistent hypercapnia patient qualified for home BiPAP which was ordered and arranged at SNF.  Oxygen was gradually weaned to 2 L nasal cannula and she is being discharged on this dose. There was concern of CHF, echocardiogram revealed moderate MR and TR with preserved EF.  She received some Lasix  but was stable without.  COPD likely the primary driver of her respiratory symptoms  Acute hypoxic and hypercapnic respiratory failure - Secondary to COPD exacerbation and possible pneumonia - CO2 persistently elevated - Home BiPAP ordered and arranged - Stable on 2 L [place].  DC with this  Questionable CHF - Echo with moderate MR and TR.  Preserved EF. - Stable off of Lasix   Hyponatremia - Resolved with IV fluids.  COPD exacerbation - No formal diagnosis of COPD but given findings on CT scan and smoking history this is likely. -- No longer smoking, reports she quit some time ago but cannot quantify when this is or how much she was smoking at that time - RVP negative - CT PE negative for PE.  Diffuse peribronchovascular nodularities and bronchial wall thickening consistent with airway inflammation. - Status post 5-day prednisone  course - Sputum culture with normal flora - Started on Breztri .  Will discharge with this - Patient refuses flu vaccine - Pulmonology referral at DC for PFTs  Community-acquired pneumonia - Status post ceftriaxone  azithromycin  5-day course  Loose stools - No diarrhea or abdominal pain.  Patient reports this has happened previously with steroid therapy.  Prednisone  now finished - As needed loperamide   Elevated troponin - Thought to be  secondary to demand ischemia - Troponin 26-31 on repeat --EKG without evidence of STEMI  Severe vitamin D  deficiency - 10.3 on arrival.  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 - Patient discharging directly to SNF today.
